# Patient Record
Sex: Female | Born: 1950 | Race: White | Hispanic: No | Marital: Married | Smoking: Never smoker
Health system: Southern US, Community
[De-identification: ages and names within clinical notes are randomized; demographics above are authoritative.]

## PROBLEM LIST (undated history)

## (undated) DIAGNOSIS — G43909 Migraine, unspecified, not intractable, without status migrainosus: Secondary | ICD-10-CM

## (undated) HISTORY — PX: TONSILLECTOMY: SUR1361

---

## 2000-04-18 ENCOUNTER — Other Ambulatory Visit: Admission: RE | Admit: 2000-04-18 | Discharge: 2000-04-18 | Payer: Self-pay | Admitting: *Deleted

## 2000-04-26 ENCOUNTER — Encounter: Payer: Self-pay | Admitting: *Deleted

## 2000-04-26 ENCOUNTER — Encounter: Admission: RE | Admit: 2000-04-26 | Discharge: 2000-04-26 | Payer: Self-pay | Admitting: *Deleted

## 2001-08-07 ENCOUNTER — Other Ambulatory Visit: Admission: RE | Admit: 2001-08-07 | Discharge: 2001-08-07 | Payer: Self-pay | Admitting: *Deleted

## 2006-07-11 ENCOUNTER — Encounter: Admission: RE | Admit: 2006-07-11 | Discharge: 2006-07-11 | Payer: Self-pay | Admitting: Obstetrics & Gynecology

## 2007-08-23 ENCOUNTER — Encounter: Admission: RE | Admit: 2007-08-23 | Discharge: 2007-08-23 | Payer: Self-pay | Admitting: Obstetrics

## 2010-03-21 ENCOUNTER — Encounter: Payer: Self-pay | Admitting: Obstetrics & Gynecology

## 2011-01-15 ENCOUNTER — Inpatient Hospital Stay (HOSPITAL_COMMUNITY)
Admission: EM | Admit: 2011-01-15 | Discharge: 2011-02-04 | DRG: 810 | Disposition: A | Discharge status: Home Health | Payer: BC Managed Care – PPO | Attending: Neurological Surgery | Admitting: Neurological Surgery

## 2011-01-15 ENCOUNTER — Other Ambulatory Visit: Payer: Self-pay

## 2011-01-15 ENCOUNTER — Inpatient Hospital Stay (HOSPITAL_COMMUNITY): Payer: BC Managed Care – PPO

## 2011-01-15 ENCOUNTER — Emergency Department (HOSPITAL_COMMUNITY): Payer: BC Managed Care – PPO

## 2011-01-15 ENCOUNTER — Encounter: Payer: Self-pay | Admitting: Emergency Medicine

## 2011-01-15 DIAGNOSIS — I609 Nontraumatic subarachnoid hemorrhage, unspecified: Principal | ICD-10-CM | POA: Diagnosis present

## 2011-01-15 DIAGNOSIS — R291 Meningismus: Secondary | ICD-10-CM | POA: Diagnosis present

## 2011-01-15 DIAGNOSIS — E119 Type 2 diabetes mellitus without complications: Secondary | ICD-10-CM | POA: Diagnosis present

## 2011-01-15 DIAGNOSIS — I67848 Other cerebrovascular vasospasm and vasoconstriction: Secondary | ICD-10-CM

## 2011-01-15 DIAGNOSIS — K5900 Constipation, unspecified: Secondary | ICD-10-CM | POA: Diagnosis not present

## 2011-01-15 DIAGNOSIS — G43909 Migraine, unspecified, not intractable, without status migrainosus: Secondary | ICD-10-CM | POA: Diagnosis present

## 2011-01-15 DIAGNOSIS — J45909 Unspecified asthma, uncomplicated: Secondary | ICD-10-CM | POA: Diagnosis present

## 2011-01-15 DIAGNOSIS — R112 Nausea with vomiting, unspecified: Secondary | ICD-10-CM | POA: Diagnosis present

## 2011-01-15 HISTORY — DX: Migraine, unspecified, not intractable, without status migrainosus: G43.909

## 2011-01-15 LAB — CBC
MCH: 33.3 pg (ref 26.0–34.0)
MCHC: 34.4 g/dL (ref 30.0–36.0)
Platelets: 284 10*3/uL (ref 150–400)
RBC: 3.96 MIL/uL (ref 3.87–5.11)

## 2011-01-15 LAB — GLUCOSE, CAPILLARY: Glucose-Capillary: 101 mg/dL — ABNORMAL HIGH (ref 70–99)

## 2011-01-15 LAB — PROTIME-INR: Prothrombin Time: 13.6 seconds (ref 11.6–15.2)

## 2011-01-15 MED ORDER — NIMODIPINE 30 MG PO CAPS
60.0000 mg | ORAL_CAPSULE | ORAL | Status: DC
  Administered 2011-01-16: 60 mg via ORAL
  Filled 2011-01-15 (×30): qty 2

## 2011-01-15 MED ORDER — SENNOSIDES-DOCUSATE SODIUM 8.6-50 MG PO TABS
1.0000 | ORAL_TABLET | Freq: Two times a day (BID) | ORAL | Status: DC
  Administered 2011-01-17 – 2011-02-04 (×33): 1 via ORAL
  Filled 2011-01-15 (×36): qty 1

## 2011-01-15 MED ORDER — MORPHINE SULFATE 2 MG/ML IJ SOLN
1.0000 mg | INTRAMUSCULAR | Status: DC | PRN
  Administered 2011-01-15 – 2011-01-20 (×28): 2 mg via INTRAVENOUS
  Administered 2011-01-21: 4 mg via INTRAVENOUS
  Administered 2011-01-21: 2 mg via INTRAVENOUS
  Administered 2011-01-21 (×2): 4 mg via INTRAVENOUS
  Administered 2011-01-21: 2 mg via INTRAVENOUS
  Administered 2011-01-21 – 2011-01-23 (×7): 4 mg via INTRAVENOUS
  Administered 2011-01-23 – 2011-01-24 (×2): 2 mg via INTRAVENOUS
  Administered 2011-01-24: 4 mg via INTRAVENOUS
  Administered 2011-01-24 – 2011-01-26 (×9): 2 mg via INTRAVENOUS
  Administered 2011-01-27: 4 mg via INTRAVENOUS
  Administered 2011-01-27 – 2011-01-28 (×4): 2 mg via INTRAVENOUS
  Filled 2011-01-15 (×3): qty 1
  Filled 2011-01-15: qty 2
  Filled 2011-01-15 (×4): qty 1
  Filled 2011-01-15: qty 2
  Filled 2011-01-15 (×14): qty 1
  Filled 2011-01-15: qty 2
  Filled 2011-01-15: qty 1
  Filled 2011-01-15: qty 2
  Filled 2011-01-15 (×3): qty 1
  Filled 2011-01-15 (×2): qty 2
  Filled 2011-01-15 (×5): qty 1
  Filled 2011-01-15: qty 2
  Filled 2011-01-15 (×7): qty 1
  Filled 2011-01-15: qty 2
  Filled 2011-01-15 (×3): qty 1
  Filled 2011-01-15: qty 2
  Filled 2011-01-15 (×2): qty 1
  Filled 2011-01-15 (×3): qty 2
  Filled 2011-01-15 (×5): qty 1

## 2011-01-15 MED ORDER — IOHEXOL 350 MG/ML SOLN
60.0000 mL | Freq: Once | INTRAVENOUS | Status: AC | PRN
  Administered 2011-01-15: 60 mL via INTRAVENOUS

## 2011-01-15 MED ORDER — MORPHINE SULFATE 4 MG/ML IJ SOLN
INTRAMUSCULAR | Status: AC
  Administered 2011-01-15: 2 mg
  Filled 2011-01-15: qty 1

## 2011-01-15 MED ORDER — ACETAMINOPHEN 650 MG RE SUPP: 650.0000 mg | RECTAL | Status: DC | PRN

## 2011-01-15 MED ORDER — PANTOPRAZOLE SODIUM 40 MG IV SOLR
40.0000 mg | Freq: Every day | INTRAVENOUS | Status: DC
  Administered 2011-01-15 – 2011-01-17 (×3): 40 mg via INTRAVENOUS
  Filled 2011-01-15 (×4): qty 40

## 2011-01-15 MED ORDER — SODIUM CHLORIDE 0.9 % IV SOLN
INTRAVENOUS | Status: DC
  Administered 2011-01-15 – 2011-01-17 (×4): via INTRAVENOUS

## 2011-01-15 MED ORDER — NIMODIPINE 30 MG/ML ORAL SOLUTION
60.0000 mg | ORAL | Status: DC
  Administered 2011-01-16 – 2011-01-19 (×17): 60 mg
  Filled 2011-01-15 (×30): qty 2

## 2011-01-15 MED ORDER — ONDANSETRON HCL 4 MG/2ML IJ SOLN
4.0000 mg | Freq: Once | INTRAMUSCULAR | Status: DC
  Filled 2011-01-15: qty 2

## 2011-01-15 MED ORDER — ACETAMINOPHEN 325 MG PO TABS: 650.0000 mg | ORAL_TABLET | ORAL | Status: DC | PRN

## 2011-01-15 MED ORDER — FENTANYL CITRATE 0.05 MG/ML IJ SOLN
50.0000 ug | Freq: Once | INTRAMUSCULAR | Status: DC
  Filled 2011-01-15: qty 2

## 2011-01-15 MED ORDER — ONDANSETRON HCL 4 MG/2ML IJ SOLN
4.0000 mg | Freq: Four times a day (QID) | INTRAMUSCULAR | Status: DC | PRN
  Administered 2011-01-15 – 2011-01-24 (×9): 4 mg via INTRAVENOUS
  Filled 2011-01-15 (×10): qty 2

## 2011-01-15 MED ORDER — LABETALOL HCL 5 MG/ML IV SOLN
10.0000 mg | INTRAVENOUS | Status: DC | PRN
Start: 2011-01-15 — End: 2011-02-04
  Filled 2011-01-15: qty 8

## 2011-01-15 NOTE — ED Notes (Signed)
PT. ARRIVED WITH EMS FORM [facility] ER- REPORTS SEVERE HEADACHE ONSET LAST NIGHT CT SCAN RESULT SHOWS ACUTE SUBARACHNOID HEMORRHAGE .  RECEIVED MORPHINE 8 MG IV AND ZOFRAN 4 MG IV PRIOR TO ARRIVAL .

## 2011-01-15 NOTE — ED Notes (Signed)
ASSUMED CARE ON PT. INTRODUCED SELF , CALL LIGHT WITHIN REACH , PLACED ON MONITOR ,  BEDRAILS UP , REPOSITIONED FOR COMFORT. RESPIRATIONS UNLABORED , IV BY EMS INTACT . PT. WAITING FOR EDP EVALUATION . DR, Danielle Dess NOTIFIED ON PT.S' ARRIVAL.

## 2011-01-15 NOTE — Progress Notes (Signed)
  Patient has undergone CT angiography. The study demonstrates no evidence of an aneurysm. When combined with the evidence from the CT of blood in the ambient cistern and the fourth ventricle the patient's good neurologic status it appears the patient has sustained a perimesencephalic venous hemorrhage. The situation was discussed with the patient and her family. Will be maintained in the intensive care unit for observation calcium channel blocking agents have been started. He will determine if formal catheter angiography is necessary.

## 2011-01-15 NOTE — ED Notes (Signed)
MD at bedside. Dr. Caralee Ates at bedside.

## 2011-01-15 NOTE — Progress Notes (Signed)
Speech Language/Pathology Orders received for Speech Language Evaluation and Treatment. Evaluation not completed due to patient presenting with nausea and unable to participate. ST to follow on 01-16-11 Moreen Fowler, M.S., CCC-SLP [phone]

## 2011-01-15 NOTE — H&P (Addendum)
Kaitlin Banks is an 60 y.o. female.   Chief Complaint: Acute onset headache HPI: Patient is a 60 year old right-handed white female who developed headache approximately 10 PM last night the headache was acute in onset cause her to become nauseous and vomit. It seemed to worsen sometime in the middle of the night at approximately 3:15 this morning she was brought to the emergency department at [facility]. A CT scan of the head was performed demonstrated a blood in the basilar cisterns and blood in the fourth ventricle. Is transferred to [facility] emergency room for further evaluation and treatment of this process. The patient gives no history of previous headache and in general her health has been very good.S he relates no significant surgery other than tonsillectomy as a young person. She does not see a physician regularly but when she does it is that [facility].  Past Medical History  Diagnosis Date  . Migraine headache   . Asthma   . Diabetes mellitus     Past Surgical History  Procedure Date  . Tonsillectomy     History reviewed. No pertinent family history. Social History:  reports that she has never smoked. She does not have any smokeless tobacco history on file. She reports that she does not drink alcohol or use illicit drugs.  Allergies: No Known Allergies  Medications Prior to Admission  Medication Dose Route Frequency Provider Last Rate Last Dose  . 0.9 %  sodium chloride infusion   Intravenous Continuous Shary Key Theadore Blunck 100 mL/hr at 01/17/11 2014    . acetaminophen (TYLENOL) tablet 650 mg  650 mg Oral Q4H PRN Shary Key Wilhelmena Zea       Or  . acetaminophen (TYLENOL) suppository 650 mg  650 mg Rectal Q4H PRN Stefani Dama      . iohexol (OMNIPAQUE) 350 MG/ML injection 60 mL  60 mL Intravenous Once PRN Medication Radiologist   60 mL at 01/15/11 0821  . labetalol (NORMODYNE,TRANDATE) injection 10-40 mg  10-40 mg Intravenous Q10 min PRN Stefani Dama      .  morphine 2 MG/ML injection 1-4 mg  1-4 mg Intravenous Q1H PRN Shary Key Braelee Herrle   2 mg at 01/19/11 0316  . morphine 4 MG/ML injection        2 mg at 01/15/11 1331  . niMODipine (NIMOTOP) capsule 60 mg  60 mg Oral Q4H Tydarius Yawn J Fabiana Dromgoole   60 mg at 01/16/11 2215   Or  . niMODipine (NIMOTOP) 30 mg/mL oral solution 60 mg  60 mg Per Tube Q4H Beryl Balz J Briceyda Abdullah   60 mg at 01/19/11 0653  . ondansetron (ZOFRAN) injection 4 mg  4 mg Intravenous Q6H PRN Shary Key Litzi Binning   4 mg at 01/18/11 2101  . pantoprazole sodium (PROTONIX) 40 mg/20 mL oral suspension 40 mg  40 mg Per Tube Q1200 Kendra P Hiatt, PHARMD      . senna-docusate (Senokot-S) tablet 1 tablet  1 tablet Oral BID Shary Key Elfa Wooton   1 tablet at 01/18/11 2201  . DISCONTD: fentaNYL (SUBLIMAZE) injection 50 mcg  50 mcg Intravenous Once Hurman Horn, MD      . DISCONTD: ondansetron [facility]) injection 4 mg  4 mg Intravenous Once Hurman Horn, MD      . DISCONTD: pantoprazole (PROTONIX) injection 40 mg  40 mg Intravenous QHS Shary Key Naziya Hegwood   40 mg at 01/17/11 2203   No current outpatient prescriptions on file as of 01/19/2011.  No results found for this or any previous visit (from the past 48 hour(s)). Ct Head Wo Contrast  01/18/2011  *RADIOLOGY REPORT*  Clinical Data: Followup subarachnoid hemorrhage.  Rule out hydrocephalus.  CT HEAD WITHOUT CONTRAST  Technique:  Contiguous axial images were obtained from the base of the skull through the vertex without contrast.  Comparison: 01/15/2011.  Findings: There is been decrease in the amount of subarachnoid blood which has shifted position slightly.  Subarachnoid blood remains.  No interval development of hydrocephalus or CT evidence of large acute infarct.  No intracranial mass lesion detected on this unenhanced exam.  Polypoid opacification left maxillary sinus.  Minimal mucosal thickening right maxillary sinus and mild mucosal thickening ethmoid sinus air cells.  IMPRESSION: Decrease in amount of subarachnoid blood  which has shifted position slightly (more notable in the left parietal - occipital lobe region).  No evidence of hydrocephalus or CT findings of large acute infarct.  Original Report Authenticated By: Fuller Canada, M.D.    Review of Systems  Constitutional: Negative.   Eyes: Negative.   Respiratory: Negative.   Cardiovascular: Negative.   Gastrointestinal: Positive for nausea and vomiting.  Musculoskeletal: Negative.   Skin: Negative.   Neurological: Positive for headaches.  Endo/Heme/Allergies:       Borderline diabetes    Blood pressure 120/56, pulse 59, temperature 98.7 F (37.1 C), temperature source Oral, resp. rate 11, height 5\' 4"  (1.626 m), weight 67.4 kg (148 lb 9.4 oz), SpO2 94.00%. Physical Exam : She is arousable to voice. She follows commands moving all 4 extremities answers questions appropriately but is lethargic. When not actively engaged in conversation she lays with her eyes closed. She admits to headache and significant neck stiffness lifts her head off the low with great difficulty and planing of pain in the back of the neck. Her pupils are 3 mm briskly reactive to light and accommodation the extraocular movements are full the face is symmetric to grimace tongue and uvula are in the midline facial sensation appears symmetric and all 3 distributions. 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  Extremities reveal no evidence of a cortical drift tone is normal and deep tendon reflexes are normal in the biceps triceps patellae and Achilles Babinski reflexes are equivocal bilaterally. Gen. physical exam reveals that the neck shows no masses no bruits are heard heart has a regular rate and rhythm no murmurs noted. Lungs are clear to auscultation. Abdomen is soft bowel sounds are positive no masses are noted. Extremities reveal no cyanosis clubbing or edema.  Assessment/Plan The patient has a subarachnoid hemorrhage in the basilar cisterns of the brain there is blood in the fourth ventricle.  Hunt and Hess grade 2.  Patient will undergo a CT angiogram for evaluation of an aneurysm. If present decision will be made whether to coil or to clip this aneurysm surgically. Interventional radiology has been notified. The patient will be admitted to the intensive care unit for monitoring after the angiogram.  Elizette Shek J 01/19/2011, 9:02 AM

## 2011-01-15 NOTE — ED Provider Notes (Signed)
Medical Screening Exam initiated and feel Pt stable for completion of MSE by private attending.  This 60 year old female with history of migraine headaches had a sudden onset severe headache last night different than her prior migraines without associated neurologic deficits and was diagnosed by CT scan at an outside facility of having a subarachnoid hemorrhage. She was transferred here to [facility] to have an evaluation by neurosurgery. The patient has no change in speech vision swallowing or understanding and no localized weakness numbness or incoordination. Her airway is patent and maintained with normal speech, her vision she states is normal for her with extraocular movements intact, her lungs are clear to auscultation with breathing unlabored, her Glasgow Coma Scale is 15. She appears stable for completion of the medical screening examination by neurosurgery. Neurosurgery has already ordered a cerebral angiogram.  The patient still has ongoing headache and nausea with medications ordered upon my initial examination in the ED.7:19 AM  Hurman Horn, MD 01/15/11 [phone]

## 2011-01-16 ENCOUNTER — Other Ambulatory Visit: Payer: Self-pay

## 2011-01-16 NOTE — Progress Notes (Signed)
PT Cancellation Note  PT evaluation cancelled today due to medical issues with patient which prohibited therapy - patient complained of severe headache. Patient refused to participate in am and pm. Will return tomorrow to attempt PT eval.  Durenda Hurt. Renaldo Fiddler, [facility] Services Pager [phone]

## 2011-01-16 NOTE — Progress Notes (Signed)
Subjective: Patient reports That she has moderately severe headache. She has some sensitivity to light. She's also complaining of some nausea. She denies any numbness tingling or weakness. She denies any diplopia.  Objective: Vital signs in last 24 hours: Temp:  [98.6 F (37 C)-99.6 F (37.6 C)] 98.8 F (37.1 C) (11/18 0400) Pulse Rate:  [56-81] 77  (11/18 0900) Resp:  [10-19] 13  (11/18 0900) BP: (89-150)/(41-74) 134/70 mmHg (11/18 0900) SpO2:  [91 %-97 %] 95 % (11/18 0900)  Intake/Output from previous day: 11/17 0701 - 11/18 0700 In: 2192 [I.V.:2180; IV Piggyback:12] Out: 500 [Urine:500] Intake/Output this shift: Total I/O In: 100 [I.V.:100] Out: -   On examination: She is awake and alert. She is oriented and appropriate. Cranial nerve function is intact. Motor exam is 5 over 5 bilaterally. No evidence of pronator drift. Chest and abdomen benign.  Lab Results:  Basename 01/15/11 1050  WBC 11.8*  HGB 13.2  HCT 38.4  PLT 284   BMET No results found for this basename: NA:2,K:2,CL:2,CO2:2,GLUCOSE:2,BUN:2,CREATININE:2,CALCIUM:2 in the last 72 hours  Studies/Results: Ct Angio Head W/cm &/or Wo Cm  01/15/2011  *RADIOLOGY REPORT*  Clinical Data:  Headache.  Subarachnoid hemorrhage  CT ANGIOGRAPHY HEAD AND NECK  Technique:  Multidetector CT imaging of the head and neck was performed using the standard protocol during bolus administration of intravenous contrast.  Multiplanar CT image reconstructions including MIPs were obtained to evaluate the vascular anatomy. Carotid stenosis measurements (when applicable) are obtained utilizing NASCET criteria, using the distal internal carotid diameter as the denominator.  Contrast: 60mL OMNIPAQUE IOHEXOL 350 MG/ML IV SOLN  Comparison:  None  CTA NECK  Findings:  Standard branching aortic arch.  No significant atherosclerotic disease of the aortic arch.  Right carotid:  Common carotid artery is widely patent.  Carotid bifurcation is widely patent  without stenosis.  No evidence of atherosclerotic disease.  No dissection or aneurysm.  Left carotid:  Left common carotid artery is widely patent. Carotid bifurcation and internal carotid artery are  widely patent. No evidence of atherosclerotic disease or dissection.  Vertebral arteries:  Both vertebral arteries are equal in size and patent to the basilar without stenosis or dissection.   Review of the MIP images confirms the above findings.  IMPRESSION: Negative  CTA HEAD  Findings:  Preliminary unenhanced images of the brain reveal subarachnoid hemorrhage in the basilar cisterns.  This is relatively symmetric surrounding the mid brain.  There is a small amount of blood at the  base of the brain.  No intraventricular hemorrhage is present.  There is no hydrocephalus.  No acute infarct or mass.  Both vertebral arteries are patent to the basilar without stenosis. PICA, AICA, superior cerebellar, and posterior cerebral arteries are widely patent without stenosis.  The cavernous carotid is widely patent without stenosis.  Anterior and middle cerebral arteries are patent bilaterally without stenosis.  Negative for aneurysm or vascular malformation.  No cause for subarachnoid hemorrhage is identified.   Review of the MIP images confirms the above findings.  IMPRESSION: .  Perimesencephalic subarachnoid hemorrhage.  No vascular malformation is identified.  This type of  hemorrhage can be seen with nonaneurysmal subarachnoid hemorrhage.  Original Report Authenticated By: Camelia Phenes, M.D.   Ct Angio Neck W/cm &/or Wo/cm  01/15/2011  *RADIOLOGY REPORT*  Clinical Data:  Headache.  Subarachnoid hemorrhage  CT ANGIOGRAPHY HEAD AND NECK  Technique:  Multidetector CT imaging of the head and neck was performed using the standard protocol during  bolus administration of intravenous contrast.  Multiplanar CT image reconstructions including MIPs were obtained to evaluate the vascular anatomy. Carotid stenosis measurements  (when applicable) are obtained utilizing NASCET criteria, using the distal internal carotid diameter as the denominator.  Contrast: 60mL OMNIPAQUE IOHEXOL 350 MG/ML IV SOLN  Comparison:  None  CTA NECK  Findings:  Standard branching aortic arch.  No significant atherosclerotic disease of the aortic arch.  Right carotid:  Common carotid artery is widely patent.  Carotid bifurcation is widely patent without stenosis.  No evidence of atherosclerotic disease.  No dissection or aneurysm.  Left carotid:  Left common carotid artery is widely patent. Carotid bifurcation and internal carotid artery are  widely patent. No evidence of atherosclerotic disease or dissection.  Vertebral arteries:  Both vertebral arteries are equal in size and patent to the basilar without stenosis or dissection.   Review of the MIP images confirms the above findings.  IMPRESSION: Negative  CTA HEAD  Findings:  Preliminary unenhanced images of the brain reveal subarachnoid hemorrhage in the basilar cisterns.  This is relatively symmetric surrounding the mid brain.  There is a small amount of blood at the  base of the brain.  No intraventricular hemorrhage is present.  There is no hydrocephalus.  No acute infarct or mass.  Both vertebral arteries are patent to the basilar without stenosis. PICA, AICA, superior cerebellar, and posterior cerebral arteries are widely patent without stenosis.  The cavernous carotid is widely patent without stenosis.  Anterior and middle cerebral arteries are patent bilaterally without stenosis.  Negative for aneurysm or vascular malformation.  No cause for subarachnoid hemorrhage is identified.   Review of the MIP images confirms the above findings.  IMPRESSION: .  Perimesencephalic subarachnoid hemorrhage.  No vascular malformation is identified.  This type of  hemorrhage can be seen with nonaneurysmal subarachnoid hemorrhage.  Original Report Authenticated By: Camelia Phenes, M.D.    Assessment/Plan: Status post  non-aneurysmal subarachnoid hemorrhage. Plan continue IV hydration and ICU observation. Will slowly mobilized.  LOS: 1 day     Andris Brothers A 01/16/2011, 10:11 AM

## 2011-01-17 LAB — BASIC METABOLIC PANEL
BUN: 12 mg/dL (ref 6–23)
Calcium: 8.4 mg/dL (ref 8.4–10.5)
Creatinine, Ser: 0.65 mg/dL (ref 0.50–1.10)
GFR calc Af Amer: >90 mL/min (ref >90)
GFR calc non Af Amer: >90 mL/min (ref >90)
Glucose, Bld: 82 mg/dL (ref 70–99)

## 2011-01-17 LAB — CBC
HCT: 37.2 % (ref 36.0–46.0)
Hemoglobin: 12.8 g/dL (ref 12.0–15.0)
MCH: 33.8 pg (ref 26.0–34.0)
MCHC: 34.4 g/dL (ref 30.0–36.0)
RDW: 12.4 % (ref 11.5–15.5)

## 2011-01-17 NOTE — Progress Notes (Signed)
Occupational Therapy Evaluation Patient Details Name: Kaitlin Banks MRN: [id number] DOB: 10/05/1950 Today's Date: 01/17/2011  Problem List:  Patient Active Problem List  Diagnoses  . Subarachnoid hemorrhage    Past Medical History:  Past Medical History  Diagnosis Date  . Migraine headache   . Asthma   . Diabetes mellitus    Past Surgical History:  Past Surgical History  Procedure Date  . Tonsillectomy     OT Assessment/Plan/Recommendation OT Assessment Clinical Impression Statement: Patient s/p SAH with greatest limiting factors being pain and dizziness related to headache. Will benefit from skilled OT in the acute setting to maximize independence with ADL and ADL mobility prior to d/c home. OT Recommendation/Assessment: Patient will need skilled OT in the acute care venue OT Problem List: Decreased activity tolerance;Impaired balance (sitting and/or standing);Decreased knowledge of use of DME or AE;Decreased knowledge of precautions;Pain OT Plan OT Frequency: Min 2X/week OT Treatment/Interventions: Self-care/ADL training;DME and/or AE instruction;Therapeutic activities;Patient/family education;Balance training OT Recommendation Follow Up Recommendations: Home health OT (pending patient progress) Equipment Recommended:  (TBA) Individuals Consulted Consulted and Agree with Results and Recommendations: Family member/caregiver;Patient Family Member Consulted: husband OT Goals Acute Rehab OT Goals OT Goal Formulation: With patient/family Time For Goal Achievement: 7 days ADL Goals Pt Will Perform Grooming: Independently;Standing at sink ADL Goal: Grooming - Progress: Other (comment) Pt Will Perform Lower Body Bathing: with modified independence;Sit to stand in shower ADL Goal: Lower Body Bathing - Progress: Other (comment) Pt Will Perform Lower Body Dressing: Independently;Sit to stand from bed ADL Goal: Lower Body Dressing - Progress: Other (comment) Pt Will Transfer  to Toilet: Independently;Ambulation;Regular height toilet ADL Goal: Toilet Transfer - Progress: Other (comment) Pt Will Perform Toileting - Clothing Manipulation: with modified independence ADL Goal: Toileting - Clothing Manipulation - Progress: Other (comment) Pt Will Perform Toileting - Hygiene: Independently;Sitting on 3-in-1 or toilet ADL Goal: Toileting - Hygiene - Progress: Other (comment) Pt Will Perform Tub/Shower Transfer: Shower transfer;Independently;Ambulation (built in shower seat and hand held shower head) ADL Goal: Tub/Shower Transfer - Progress: Other (comment)  OT Evaluation Precautions/Restrictions  Precautions Precautions: Fall Required Braces or Orthoses: No Restrictions Weight Bearing Restrictions: No Prior Functioning Home Living Lives With: Spouse Type of Home: House Home Layout: One level Home Access: Stairs to enter [employer] of Steps: 1 Bathroom Shower/Tub: Tub/shower unit;Walk-in shower;Door [employer] Toilet: Standard Bathroom Accessibility: No Hotel manager with walk-in shower not accessible with RW.) Home Adaptive Equipment: None Prior Function Level of Independence: Independent with basic ADLs;Independent with homemaking with ambulation Able to Take Stairs?: Reciprically Driving: Yes Vocation: Full time employment Leisure: Hobbies-yes (Comment) (designing) ADL ADL Toilet Transfer: Performed;Minimal assistance Toilet Transfer Details (indicate cue type and reason): pt with difficulty keeping eyes open secondary to HA pain Toilet Transfer Method: Stand pivot Toilet Transfer Equipment: Bedside commode Toileting - Clothing Manipulation: Performed;Minimal assistance Where Assessed - Toileting Clothing Manipulation: Standing Toileting - Hygiene: Performed;Set up;Minimal assistance (assist required to maintain standing balance) Where Assessed - Toileting Hygiene: Standing ADL Comments: limited eval secondary to patient HA  pain Vision/Perception  Vision - Assessment Vision Assessment: Vision not tested Additional Comments: Will test next session Cognition Cognition Orientation Level: Oriented X4 Sensation/Coordination Sensation Light Touch: Appears Intact Coordination Gross Motor Movements are Fluid and Coordinated: Yes Fine Motor Movements are Fluid and Coordinated: Yes Extremity Assessment RUE Assessment RUE Assessment: Within Functional Limits LUE Assessment LUE Assessment: Within Functional Limits Mobility  Bed Mobility Bed Mobility: Yes Rolling Left: 4: Min assist Left Sidelying to Sit: 4:  Min assist;HOB elevated (comment degrees);With rails (~20) Sit to Supine - Left: 4: Min assist;With rail;HOB flat Transfers Transfers: Yes Sit to Stand: 4: Min assist;From bed;With upper extremity assist Sit to Stand Details (indicate cue type and reason): dizziness upon standing. BP stable sitting and standing. Dropped as patient returning to supine after toileting. MD in room and made aware. Stand to Sit: 4: Min assist;With upper extremity assist;To bed;To chair/3-in-1 (VC for hand placement) End of Session OT - End of Session Equipment Utilized During Treatment: Gait belt Activity Tolerance: Patient limited by pain (and dizziness) Patient left: in bed;with call bell in reach;with family/visitor present Nurse Communication: Mobility status for transfers;Mobility status for ambulation General Behavior During Session: Lethargic Cognition: WFL for tasks performed   Bellamarie Pflug 01/17/2011, 5:05 PM

## 2011-01-17 NOTE — Progress Notes (Signed)
Speech Language Pathology SLP Cancellation Note Treatment cancelled today due to patient's refusal to participate due to fatigue.  Will attempt to complete cognitive-linguistic assessment on 01/18/11.  Myra Rude, M.S.,CCC-SLP Pager [phone]

## 2011-01-17 NOTE — Progress Notes (Signed)
PT Cancellation Note Evaluation cancelled today due to patient's refusal to participate secondary to feeling unwell. She requests that we return tomorrow. 01/17/2011 Edwyna Perfect, PT  Pager [phone]

## 2011-01-17 NOTE — Progress Notes (Signed)
Subjective: Patient reports Headache  Objective: Vital signs in last 24 hours: Temp:  [98.9 F (37.2 C)-99.5 F (37.5 C)] 99.5 F (37.5 C) (11/19 1925) Pulse Rate:  [54-95] 56  (11/19 2200) Resp:  [12-23] 21  (11/19 2200) BP: (105-129)/(49-59) 114/55 mmHg (11/19 2200) SpO2:  [88 %-100 %] 97 % (11/19 2200)  Intake/Output from previous day: 11/18 0701 - 11/19 0700 In: 2440 [P.O.:30; I.V.:2400; IV Piggyback:10] Out: 1150 [Urine:1150] Intake/Output this shift: Total I/O In: 420 [P.O.:120; I.V.:300] Out: 750 [Urine:750]  arouses easily to voice,moces all extremities without evidence of drift  Lab Results:  [facility] 01/17/11 0603 01/15/11 1050  WBC 11.4* 11.8*  HGB 12.8 13.2  HCT 37.2 38.4  PLT 251 284   BMET  Basename 01/17/11 0603  NA 140  K 3.3*  CL 103  CO2 26  GLUCOSE 82  BUN 12  CREATININE 0.65  CALCIUM 8.4    Studies/Results: No results found.  Assessment/Plan: Subarachnoid Grade2  LOS: 2 days  start to mobilize   Shantia Sanford J 01/17/2011, 11:53 PM

## 2011-01-18 ENCOUNTER — Inpatient Hospital Stay (HOSPITAL_COMMUNITY): Payer: BC Managed Care – PPO

## 2011-01-18 MED ORDER — PANTOPRAZOLE SODIUM 40 MG PO PACK
40.0000 mg | PACK | Freq: Every day | ORAL | Status: DC
  Administered 2011-01-20 – 2011-02-03 (×13): 40 mg
  Filled 2011-01-18 (×18): qty 20

## 2011-01-18 NOTE — Progress Notes (Signed)
OT Cancellation Note  Treatment cancelled today due to patient's refusal to participate: patient states nauseated and has extreme headache pain. RN is aware. Will check back as schedule allows.  Glendale Chard    OTR/L Pager: [phone] 01/18/2011

## 2011-01-18 NOTE — Progress Notes (Signed)
Physical Therapy Evaluation Patient Details Name: Kaitlin Banks MRN: [id number] DOB: [birth year] Today's Date: 01/18/2011  Problem List:  Patient Active Problem List  Diagnoses  . Subarachnoid hemorrhage    Past Medical History:  Past Medical History  Diagnosis Date  . Migraine headache   . Asthma   . Diabetes mellitus    Past Surgical History:  Past Surgical History  Procedure Date  . Tonsillectomy     PT Assessment/Plan/Recommendation PT Assessment Clinical Impression Statement: pt s/p Lt parieto-occipital SAH with resulting acute pain limiting mobility.  Pt instructed to ambulate 2-3 times per day with therapy and nursing to prepare herself for d/c home with family.  PT Recommendation/Assessment: Patient will need skilled PT in the acute care venue PT Problem List: Decreased activity tolerance;Decreased balance;Decreased mobility;Pain Barriers to Discharge: None PT Therapy Diagnosis : Difficulty walking;Acute pain PT Plan PT Frequency: Min 4X/week PT Treatment/Interventions: DME instruction;Gait training;Functional mobility training;Balance training;Patient/family education PT Recommendation Follow Up Recommendations: Home health PT Equipment Recommended: None recommended by PT PT Goals  Acute Rehab PT Goals PT Goal Formulation: With patient Time For Goal Achievement: 7 days Pt will go Supine/Side to Sit: with modified independence;with HOB 0 degrees PT Goal: Supine/Side to Sit - Progress: Other (comment) Pt will go Sit to Supine/Side: with modified independence;with HOB 0 degrees PT Goal: Sit to Supine/Side - Progress: Other (comment) Pt will Transfer Sit to Stand/Stand to Sit: with supervision;with upper extremity assist PT Transfer Goal: Sit to Stand/Stand to Sit - Progress: Other (comment) Pt will Ambulate: 51 - 150 feet;with supervision;with least restrictive assistive device PT Goal: Ambulate - Progress: Other (comment) Additional Goals Additional Goal #1:  Pt will score >19 on DGI balance assessment to indicate low fall risk PT Goal: Additional Goal #1 - Progress: Other (comment)  PT Evaluation Precautions/Restrictions  Precautions Precautions: Fall Required Braces or Orthoses: No Restrictions Weight Bearing Restrictions: No Prior Functioning  Home Living Receives Help From: Family (spouse is retired) Prior Function Comments: sets up [employer] Cognition Arousal/Alertness: Awake/alert Overall Cognitive Status: Appears within functional limits for tasks assessed Orientation Level: Oriented X4 Cognition - Other Comments: requires max encouragement; self-limiting behaviors Sensation/Coordination Sensation Light Touch: Appears Intact Extremity Assessment RLE Assessment RLE Assessment: Within Functional Limits LLE Assessment LLE Assessment: Within Functional Limits Mobility (including Balance) Bed Mobility Bed Mobility: Yes Rolling Right: 5: Supervision;With rail Rolling Right Details (indicate cue type and reason): Supervision for lines (in ICU) Right Sidelying to Sit: 5: Supervision;HOB elevated (comment degrees);With rails Right Sidelying to Sit Details (indicate cue type and reason): HOB 20; supervision due to lines Sitting - Scoot to Edge of Bed: 5: Supervision Sitting - Scoot to Edge of Bed Details (indicate cue type and reason): for safety Transfers Sit to Stand: 4: Min assist;With upper extremity assist;With armrests;From bed;From chair/3-in-1 Sit to Stand Details (indicate cue type and reason): transfer x 3 (bed, 3n1, chair); minguard A due to pt fearful of becoming dizzy Stand to Sit: 4: Min assist;With upper extremity assist;With armrests;To bed;To chair/3-in-1 Stand to Sit Details: minguard assist Ambulation/Gait Ambulation/Gait: Yes Ambulation/Gait Assistance: 4: Min assist Ambulation/Gait Assistance Details (indicate cue type and reason): second person for IV and lines Ambulation Distance (Feet): 60  Feet Assistive device: 1 person hand held assist;Other (Comment) (at times holding IV pole with other hand) Gait Pattern: Decreased step length - right;Decreased step length - left Gait velocity: very limited Stairs: No Wheelchair Mobility Wheelchair Mobility: No  Posture/Postural Control Posture/Postural Control: No significant limitations Balance Balance  Assessed: No (pt too nauseous and feeling weak/hot) Exercise    End of Session PT - End of Session Equipment Utilized During Treatment: Gait belt Activity Tolerance: Patient limited by pain;Other (comment) (self-limiting due to "weak" and "dizzy") Patient left: in chair;with call bell in reach;with family/visitor present Nurse Communication: Mobility status for ambulation General Behavior During Session: Flat affect (kept eyes closed majority of session due to lights hurting) Cognition: [facility] for tasks performed  Kaitlin Banks 01/18/2011, 3:01 PM Pager [phone]

## 2011-01-18 NOTE — Progress Notes (Signed)
Speech Language/Pathology Speech Language Pathology Evaluation Patient Details Name: Kaitlin Banks MRN: [id number] DOB: 12/02/1950 Today's Date: 01/18/2011  Problem List:  Patient Active Problem List  Diagnoses  . Subarachnoid hemorrhage    Past Medical History:  Past Medical History  Diagnosis Date  . Migraine headache   . Asthma   . Diabetes mellitus    Past Surgical History:  Past Surgical History  Procedure Date  . Tonsillectomy     SLP Assessment/Plan/Recommendation Assessment Clinical Impression Statement: Pt appears WFL with brief cognitive linguistic eval.  Higher level verbal/functional task not attempted due to pts discomfort.  Discussed signs of impairment with pt.  No SLP f/u needed at this time.  If concerns arise, please reorder.  SLP Recommendation/Assessment: Patent does not need any further Speech Lanaguage Pathology Services No Skilled Speech Therapy: All education completed;Patient at baseline level of functioning;Patient is independent with all cognitive/linguistic skills Recommendation Follow up Recommendations: None Equipment Recommended:  (TBA) Individuals Consulted Consulted and Agree with Results and Recommendations: Patient  Harlon Ditty, MA CCC-SLP [phone]  Claudine Mouton 01/18/2011, 9:26 AM

## 2011-01-18 NOTE — Progress Notes (Signed)
Subjective: Patient reports headache  Objective: Vital signs in last 24 hours: Temp:  [98.4 F (36.9 C)-99.5 F (37.5 C)] 98.4 F (36.9 C) (11/20 0900) Pulse Rate:  [54-72] 61  (11/20 1300) Resp:  [10-21] 16  (11/20 1300) BP: (102-135)/(46-68) 135/68 mmHg (11/20 1300) SpO2:  [91 %-100 %] 98 % (11/20 1300)  Intake/Output from previous day: 11/19 0701 - 11/20 0700 In: 2600 [P.O.:300; I.V.:2300] Out: 2200 [Urine:2200] Intake/Output this shift: Total I/O In: 400 [I.V.:400] Out: -   cranial nerves normal, meningismus 2+, no drift motor intact.  Lab Results:  [facility] 01/17/11 0603  WBC 11.4*  HGB 12.8  HCT 37.2  PLT 251   BMET  Basename 01/17/11 0603  NA 140  K 3.3*  CL 103  CO2 26  GLUCOSE 82  BUN 12  CREATININE 0.65  CALCIUM 8.4    Studies/Results: Ct Head Wo Contrast  01/18/2011  *RADIOLOGY REPORT*  Clinical Data: Followup subarachnoid hemorrhage.  Rule out hydrocephalus.  CT HEAD WITHOUT CONTRAST  Technique:  Contiguous axial images were obtained from the base of the skull through the vertex without contrast.  Comparison: 01/15/2011.  Findings: There is been decrease in the amount of subarachnoid blood which has shifted position slightly.  Subarachnoid blood remains.  No interval development of hydrocephalus or CT evidence of large acute infarct.  No intracranial mass lesion detected on this unenhanced exam.  Polypoid opacification left maxillary sinus.  Minimal mucosal thickening right maxillary sinus and mild mucosal thickening ethmoid sinus air cells.  IMPRESSION: Decrease in amount of subarachnoid blood which has shifted position slightly (more notable in the left parietal - occipital lobe region).  No evidence of hydrocephalus or CT findings of large acute infarct.  Original Report Authenticated By: Fuller Canada, M.D.    Assessment/Plan: Ct stable day 3 post bleed. Sah resolving, no hydrocephalus. Slow clinical progress.  LOS: 3 days  Moblize more  aggresssively. Rehab med consult   Stefani Dama 01/18/2011, 1:38 PM

## 2011-01-19 DIAGNOSIS — I609 Nontraumatic subarachnoid hemorrhage, unspecified: Secondary | ICD-10-CM

## 2011-01-19 LAB — CBC
MCH: 33.6 pg (ref 26.0–34.0)
MCHC: 34.9 g/dL (ref 30.0–36.0)
Platelets: 285 10*3/uL (ref 150–400)
RDW: 11.9 % (ref 11.5–15.5)

## 2011-01-19 LAB — BASIC METABOLIC PANEL
Calcium: 8.6 mg/dL (ref 8.4–10.5)
GFR calc Af Amer: >90 mL/min (ref >90)
GFR calc non Af Amer: >90 mL/min (ref >90)
Glucose, Bld: 113 mg/dL — ABNORMAL HIGH (ref 70–99)
Potassium: 3.1 mEq/L — ABNORMAL LOW (ref 3.5–5.1)
Sodium: 137 mEq/L (ref 135–145)

## 2011-01-19 MED ORDER — POTASSIUM CHLORIDE 20 MEQ PO PACK
20.0000 meq | PACK | Freq: Two times a day (BID) | ORAL | Status: DC
Start: 2011-01-19 — End: 2011-01-19
  Filled 2011-01-19: qty 1

## 2011-01-19 MED ORDER — POTASSIUM CHLORIDE 20 MEQ PO PACK
20.0000 meq | PACK | Freq: Two times a day (BID) | ORAL | Status: DC
  Filled 2011-01-19: qty 1

## 2011-01-19 MED ORDER — POTASSIUM CHLORIDE CRYS ER 20 MEQ PO TBCR
20.0000 meq | EXTENDED_RELEASE_TABLET | Freq: Two times a day (BID) | ORAL | Status: DC
  Filled 2011-01-19: qty 1

## 2011-01-19 MED ORDER — SODIUM CHLORIDE 0.9 % IV SOLN
INTRAVENOUS | Status: DC
  Administered 2011-01-19: 15:00:00 via INTRAVENOUS

## 2011-01-19 MED ORDER — POTASSIUM CHLORIDE 20 MEQ/15ML (10%) PO LIQD
20.0000 meq | Freq: Two times a day (BID) | ORAL | Status: DC
  Administered 2011-01-20 – 2011-02-04 (×29): 20 meq via ORAL
  Filled 2011-01-19 (×33): qty 15

## 2011-01-19 MED ORDER — NIMODIPINE 30 MG/ML ORAL SOLUTION
30.0000 mg | ORAL | Status: DC
  Administered 2011-01-20 – 2011-02-01 (×67): 30 mg via ORAL
  Filled 2011-01-19 (×83): qty 1

## 2011-01-19 MED ORDER — POTASSIUM CHLORIDE IN NACL 20-0.9 MEQ/L-% IV SOLN
INTRAVENOUS | Status: DC
  Administered 2011-01-19 – 2011-01-31 (×12): via INTRAVENOUS
  Filled 2011-01-19 (×22): qty 1000

## 2011-01-19 MED ORDER — NIMODIPINE 30 MG PO CAPS
30.0000 mg | ORAL_CAPSULE | ORAL | Status: DC
  Administered 2011-01-19 (×2): 30 mg via ORAL
  Filled 2011-01-19 (×5): qty 1

## 2011-01-19 NOTE — Progress Notes (Signed)
Occupational Therapy Treatment Patient Details Name: Kaitlin Banks MRN: [id number] DOB: 1950-10-04 Today's Date: 01/19/2011  OT Assessment/Plan OT Assessment/Plan OT Plan: Discharge plan remains appropriate Follow Up Recommendations: Home health OT- pending progress OT Goals ADL Goals ADL Goal: Toilet Transfer - Progress: Progressing toward goals ADL Goal: Toileting - Clothing Manipulation - Progress: Progressing toward goals ADL Goal: Toileting - Hygiene - Progress: Progressing toward goals  OT Treatment Precautions/Restrictions  Precautions Precautions: Fall Restrictions Weight Bearing Restrictions: No   ADL ADL Toilet Transfer: Performed;Minimal Dentist Details (indicate cue type and reason): Hand held assist and cues to open eyes Toilet Transfer Method: Proofreader: Bedside commode Toileting - Clothing Manipulation: Minimal assistance;Performed Where Assessed - Glass blower/designer Manipulation: Standing Toileting - Hygiene: Performed;Set up;Minimal assistance Where Assessed - Toileting Hygiene: Standing Mobility  Bed Mobility Sit to Supine - Left: HOB flat (Min guard) Transfers Sit to Stand: 4: Min assist;Without upper extremity assist;From chair/3-in-1 Stand to Sit: 4: Min assist;Without upper extremity assist;To bed (cues for hand placement) End of Session OT - End of Session Equipment Utilized During Treatment: Gait belt Activity Tolerance:  (Patient limited by nausea and HA pain) Patient left: in bed;with call bell in reach;with family/visitor present Nurse Communication: Mobility status for transfers;Mobility status for ambulation General Behavior During Session:  (unable to keep eyes open secondary to HA pain and nausea. ) Cognition: WFL for tasks performed  Ambermarie Honeyman  01/19/2011, 11:55 AM

## 2011-01-19 NOTE — Progress Notes (Signed)
Utilization review completed. Lleyton Byers, RN, BSN. 01/19/11 

## 2011-01-19 NOTE — Progress Notes (Signed)
I met with patient and her husband at bedside. Patient's progression with therapy limited by her headache and nausea. We will follow up Friday to assist in determining if an inpatient rehabilitation stay is needed prior to discharge home vs home with home health or outpatient therapy. Any rehabilitation will have to be  approved by insurance which has limited access over the holiday. Please, call with any questions to pager [phone].

## 2011-01-19 NOTE — Consult Note (Signed)
Physical Medicine and Rehabilitation Consult Reason for Consult:Subarachnoid hemorrhage Referring Phsyician: Dr.Elsner Kaitlin Banks is an 60 y.o. female.   HPI: 60 year old right-handed white female min 01/15/2011 acute onset of headache with nausea vomiting. CT scan of the head demonstrated blood in the basilar cisterns and blood in the fourth ventricle compatible with subarachnoid hemorrhage. CT angiogram of the head and neck were negative. Neurosurgery was consulted Dr. Danielle Dess advise conservative care. Followup cranial CT scan 11/20 with decrease in amount of subarachnoid blood which has shifted position slightly more notable in the left parietal and occipital lobe region no evidence of hydrocephalus. Physical therapy evaluation completed 1120 as patient required hand-held assistance to ambulate 60 feet. Occupational therapy evaluation yet the be completed secondary to patient with nausea and had refused initial therapy. Speech therapy evaluation noted cognition within functional limits for basic testing higher-level cognition yet to be tested. Physical medicine rehabilitation was consulted at the request of physical therapy for consideration of inpatient rehabilitation services.  Review of Systems  Constitutional: Positive for malaise/fatigue.  Eyes: Positive for photophobia. Negative for blurred vision.  Respiratory: Negative for cough and shortness of breath.   Cardiovascular: Negative for chest pain.  Gastrointestinal: Positive for nausea.  Genitourinary: Negative for dysuria.  Musculoskeletal: Positive for myalgias.       HA  Neurological: Positive for dizziness and headaches. Negative for seizures.  Psychiatric/Behavioral: Negative for depression.   Past Medical History  Diagnosis Date  . Migraine headache   . Asthma   . Diabetes mellitus    Past Surgical History  Procedure Date  . Tonsillectomy    History reviewed. No pertinent family history. Social History:  reports that  she has never smoked. She does not have any smokeless tobacco history on file. She reports that she does not drink alcohol or use illicit drugs. Allergies: No Known Allergies Medications Prior to Admission  Medication Dose Route Frequency Provider Last Rate Last Dose  . 0.9 %  sodium chloride infusion   Intravenous Continuous Shary Key Elsner 100 mL/hr at 01/17/11 2014    . acetaminophen (TYLENOL) tablet 650 mg  650 mg Oral Q4H PRN Shary Key Elsner       Or  . acetaminophen (TYLENOL) suppository 650 mg  650 mg Rectal Q4H PRN Stefani Dama      . iohexol (OMNIPAQUE) 350 MG/ML injection 60 mL  60 mL Intravenous Once PRN Medication Radiologist   60 mL at 01/15/11 0821  . labetalol (NORMODYNE,TRANDATE) injection 10-40 mg  10-40 mg Intravenous Q10 min PRN Stefani Dama      . morphine 2 MG/ML injection 1-4 mg  1-4 mg Intravenous Q1H PRN Shary Key Elsner   2 mg at 01/19/11 0316  . morphine 4 MG/ML injection        2 mg at 01/15/11 1331  . niMODipine (NIMOTOP) capsule 60 mg  60 mg Oral Q4H Henry J Elsner   60 mg at 01/16/11 2215   Or  . niMODipine (NIMOTOP) 30 mg/mL oral solution 60 mg  60 mg Per Tube Q4H Henry J Elsner   60 mg at 01/19/11 0653  . ondansetron (ZOFRAN) injection 4 mg  4 mg Intravenous Q6H PRN Shary Key Elsner   4 mg at 01/18/11 2101  . pantoprazole sodium (PROTONIX) 40 mg/20 mL oral suspension 40 mg  40 mg Per Tube Q1200 Kendra P Hiatt, PHARMD      . senna-docusate (Senokot-S) tablet 1 tablet  1 tablet Oral BID Stefani Dama  1 tablet at 01/18/11 2201  . DISCONTD: fentaNYL (SUBLIMAZE) injection 50 mcg  50 mcg Intravenous Once Hurman Horn, MD      . DISCONTD: ondansetron [facility]) injection 4 mg  4 mg Intravenous Once Hurman Horn, MD      . DISCONTD: pantoprazole (PROTONIX) injection 40 mg  40 mg Intravenous QHS Shary Key Elsner   40 mg at 01/17/11 2203   No current outpatient prescriptions on file as of 01/19/2011.    Home: Home Living Lives With: Spouse Receives Help From: Family  (spouse is retired) Type of Home: House Home Layout: One level Home Access: Stairs to enter Secretary/administrator of Steps: 1 Bathroom Shower/Tub: Tub/shower unit;Walk-in shower;Door [employer] Toilet: Standard Bathroom Accessibility: No Hotel manager with walk-in shower not accessible with RW.) Home Adaptive Equipment: None  Functional History: Prior Function Level of Independence: Independent with basic ADLs;Independent with homemaking with ambulation Able to Take Stairs?: Reciprically Driving: Yes Vocation: Full time employment 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 Functional Status:  Mobility: Bed Mobility Bed Mobility: Yes Rolling Right: 5: Supervision;With rail Rolling Right Details (indicate cue type and reason): Supervision for lines (in ICU) Rolling Left: 4: Min assist Right Sidelying to Sit: 5: Supervision;HOB elevated (comment degrees);With rails Right Sidelying to Sit Details (indicate cue type and reason): HOB 20; supervision due to lines Left Sidelying to Sit: 4: Min assist;HOB elevated (comment degrees);With rails (~20) Sitting - Scoot to Edge of Bed: 5: Supervision Sitting - Scoot to Edge of Bed Details (indicate cue type and reason): for safety Sit to Supine - Left: 4: Min assist;With rail;HOB flat Transfers Sit to Stand: 4: Min assist;With upper extremity assist;With armrests;From bed;From chair/3-in-1 Sit to Stand Details (indicate cue type and reason): transfer x 3 (bed, 3n1, chair); minguard A due to pt fearful of becoming dizzy Stand to Sit: 4: Min assist;With upper extremity assist;With armrests;To bed;To chair/3-in-1 Stand to Sit Details: minguard assist Ambulation/Gait Ambulation/Gait: Yes Ambulation/Gait Assistance: 4: Min assist Ambulation/Gait Assistance Details (indicate cue type and reason): second person for IV and lines Ambulation Distance (Feet): 60 Feet Assistive device: 1 person hand held assist;Other  (Comment) (at times holding IV pole with other hand) Gait Pattern: Decreased step length - right;Decreased step length - left Gait velocity: very limited Stairs: No Wheelchair Mobility Wheelchair Mobility: No  ADL: ADL Toilet Transfer: Performed;Minimal Dentist Details (indicate cue type and reason): pt with difficulty keeping eyes open secondary to HA pain Toilet Transfer Method: Stand pivot Acupuncturist: Set designer - Clothing Manipulation: Performed;Minimal assistance Where Assessed - Glass blower/designer Manipulation: Standing Toileting - Hygiene: Performed;Set up;Minimal assistance (assist required to maintain standing balance) Where Assessed - Toileting Hygiene: Standing ADL Comments: limited eval secondary to patient HA pain  Cognition: Cognition Overall Cognitive Status: Appears within functional limits for tasks assessed Arousal/Alertness: Awake/alert Orientation Level: Oriented X4 Attention: Focused;Sustained;Selective Focused Attention: Appears intact Sustained Attention: Appears intact Selective Attention: Appears intact Memory: Appears intact Awareness: Appears intact Problem Solving: Appears intact Executive Function: Reasoning;Decision Making;Organizing;Initiating Reasoning: Appears intact Organizing: Appears intact Decision Making: Appears intact Initiating: Appears intact Safety/Judgment: Appears intact Cognition Arousal/Alertness: Awake/alert Overall Cognitive Status: Appears within functional limits for tasks assessed Orientation Level: Oriented X4 Cognition - Other Comments: requires max encouragement; self-limiting behaviors  Blood pressure 134/61, pulse 62, temperature 98.4 F (36.9 C), temperature source Oral, resp. rate 13, height 5\' 4"  (1.626 m), weight 67.4 kg (148 lb 9.4 oz), SpO2 99.00%. Physical Exam  Constitutional: She  appears well-developed.  HENT:  Head: Normocephalic.  Eyes: Pupils are  equal, round, and reactive to light.  Neck: Normal range of motion. Neck supple. No thyromegaly present.  Cardiovascular: Normal rate and regular rhythm.   No murmur heard. Pulmonary/Chest: Effort normal. She has no wheezes.  Abdominal: Soft. She exhibits no distension. There is no tenderness.  Musculoskeletal: Normal range of motion.  Neurological:       Flat affect.Kept eyes closed during exam.Names person,place and year.    No results found for this or any previous visit (from the past 24 hour(s)). Ct Head Wo Contrast  01/18/2011  *RADIOLOGY REPORT*  Clinical Data: Followup subarachnoid hemorrhage.  Rule out hydrocephalus.  CT HEAD WITHOUT CONTRAST  Technique:  Contiguous axial images were obtained from the base of the skull through the vertex without contrast.  Comparison: 01/15/2011.  Findings: There is been decrease in the amount of subarachnoid blood which has shifted position slightly.  Subarachnoid blood remains.  No interval development of hydrocephalus or CT evidence of large acute infarct.  No intracranial mass lesion detected on this unenhanced exam.  Polypoid opacification left maxillary sinus.  Minimal mucosal thickening right maxillary sinus and mild mucosal thickening ethmoid sinus air cells.  IMPRESSION: Decrease in amount of subarachnoid blood which has shifted position slightly (more notable in the left parietal - occipital lobe region).  No evidence of hydrocephalus or CT findings of large acute infarct.  Original Report Authenticated By: Fuller Canada, M.D.    Assessment/Plan: Diagnosis: Subarachnoid hemorrhage with gait disorder and decrease function  1. Does the need for close, 24 hr/day medical supervision in concert with the patient's rehab needs make it unreasonable for this patient to be served in a less intensive setting? Yes 2. Co-Morbidities requiring supervision/potential complications: headache , nausea, constipation, DM 3. Due to bladder management, bowel  management, safety, skin/wound care, disease management, medication administration, pain management and patient education, does the patient require 24 hr/day rehab nursing? Yes 4. Does the patient require coordinated care of a physician, rehab nurse, PT (1-2 hrs/day, 5 days/week), OT and SLP to address physical and functional deficits in the context of the above medical diagnosis(es)? Yes Addressing deficits in the following areas: balance, endurance, locomotion and strength 5. Can the patient actively participate in an intensive therapy program of at least 3 hrs of therapy per day at least 5 days per week? Yes 6. The potential for patient to make measurable gains while on inpatient rehab is excellent 7. Anticipated functional outcomes upon discharge from inpatients are S/Mod I PT, S/ModI OT, S/Mod I cognitionSLP 8. Estimated rehab length of stay to reach the above functional goals is: 10day 9. Does the patient have adequate social supports to accommodate these discharge functional goals? Yes 10. Anticipated D/C setting: Home 11. Anticipated post D/C treatments: Outpt therapy 12. Overall Rehab/Functional Prognosis: excellent  RECOMMENDATIONS: This patient's condition is appropriate for continued rehabilitative care in the following setting: CIR Patient has agreed to participate in recommended program. Yes Note that insurance prior authorization may be required for reimbursement for recommended care.  Comment:   ANGIULLI,DANIEL J. 01/19/2011

## 2011-01-20 LAB — GLUCOSE, CAPILLARY: Glucose-Capillary: 106 mg/dL — ABNORMAL HIGH (ref 70–99)

## 2011-01-20 MED ORDER — PROMETHAZINE HCL 25 MG/ML IJ SOLN
12.5000 mg | Freq: Four times a day (QID) | INTRAMUSCULAR | Status: DC | PRN
  Administered 2011-01-20 – 2011-01-23 (×3): 12.5 mg via INTRAVENOUS
  Filled 2011-01-20 (×2): qty 1

## 2011-01-20 NOTE — Progress Notes (Signed)
Subjective: Patient reports Slightly worsening headache this morning. she attributes this to the noise from the IV machine and the floor overnight. She was not it would give her much sleep. She denies any significant nausea or vomiting, she denies any numbness tingling arms or legs.  Objective: Vital signs in last 24 hours: Temp:  [98 F (36.7 C)-98.6 F (37 C)] 98.3 F (36.8 C) (11/22 0949) Pulse Rate:  [49-64] 49  (11/22 0949) Resp:  [12-22] 16  (11/22 0949) BP: (125-159)/(56-80) 149/79 mmHg (11/22 0949) SpO2:  [89 %-97 %] 96 % (11/22 0949) FiO2 (%):  [2 %] 2 % (11/21 2300)  Intake/Output from previous day: 11/21 0701 - 11/22 0700 In: 1400 [I.V.:1400] Out: 500 [Urine:500] Intake/Output this shift:    She is awake she is alert she is oriented x4. Pupils are equal reactive extraocular movements are intact. Strength is 5 out of 5 in her upper and lower 70s with no pronator drift.  Lab Results:  Basename 01/19/11 1050  WBC 9.0  HGB 13.1  HCT 37.5  PLT 285   BMET  Basename 01/19/11 1050  NA 137  K 3.1*  CL 101  CO2 25  GLUCOSE 113*  BUN 10  CREATININE 0.51  CALCIUM 8.6    Studies/Results: No results found.  Assessment/Plan: Will observe throughout the day if the headache worsens we'll repeat a CT scan in the morning. Progressively mobilize and increased by mouth intake.  LOS: 5 days     Kaitlin Banks P 01/20/2011, 10:28 AM

## 2011-01-21 ENCOUNTER — Inpatient Hospital Stay (HOSPITAL_COMMUNITY): Payer: BC Managed Care – PPO

## 2011-01-21 LAB — GLUCOSE, CAPILLARY

## 2011-01-21 MED ORDER — DEXAMETHASONE SODIUM PHOSPHATE 4 MG/ML IJ SOLN
4.0000 mg | Freq: Four times a day (QID) | INTRAMUSCULAR | Status: AC
  Administered 2011-01-21 (×2): 4 mg via INTRAVENOUS
  Filled 2011-01-21 (×2): qty 1

## 2011-01-21 MED ORDER — ACETAMINOPHEN 650 MG RE SUPP: 650.0000 mg | RECTAL | Status: DC | PRN

## 2011-01-21 MED ORDER — ACETAMINOPHEN 160 MG/5ML PO SOLN
650.0000 mg | ORAL | Status: DC | PRN
  Administered 2011-01-21 – 2011-01-23 (×3): 650 mg via ORAL
  Filled 2011-01-21 (×3): qty 20.3

## 2011-01-21 MED ORDER — ACETAMINOPHEN 325 MG PO TABS
650.0000 mg | ORAL_TABLET | ORAL | Status: DC | PRN
  Filled 2011-01-21 (×2): qty 2

## 2011-01-21 NOTE — Progress Notes (Signed)
CSW received consult for possible SNF. For assessment, please see pt's chart. CSW will continue to follow and assess discharge needs.   Dede Query, MSW, Theresia Majors [phone]

## 2011-01-21 NOTE — Progress Notes (Signed)
Physical Therapy Treatment Patient Details Name: Kaitlin Banks MRN: [id number] DOB: [birth year] Today's Date: 01/21/2011  PT Assessment/Plan  PT - Assessment/Plan Comments on Treatment Session: Pt reports she is still using BSC (with assist)--educated her, spouse, and RN that pt needs to walk to bathroom with assist.  All stated agreement. Discussed with pt anticipate she can d/c home with family by Monday and should not need CIR or SNF if she continues to mobilize with nursing/family. PT Plan: Discharge plan remains appropriate;Frequency remains appropriate PT Frequency: Min 4X/week Follow Up Recommendations: Home health PT Equipment Recommended: None recommended by PT PT Goals  Acute Rehab PT Goals PT Goal: Supine/Side to Sit - Progress: Progressing toward goal PT Transfer Goal: Sit to Stand/Stand to Sit - Progress: Progressing toward goal PT Goal: Ambulate - Progress: Progressing toward goal  PT Treatment Precautions/Restrictions  Precautions Precautions: Fall Required Braces or Orthoses: No Restrictions Weight Bearing Restrictions: No Mobility (including Balance) Bed Mobility Left Sidelying to Sit: 6: Modified independent (Device/Increase time);HOB elevated (comment degrees);With rails (HOB 30; pt impatient and coming to sit before bed flat) Transfers Sit to Stand: 4: Min assist;With upper extremity assist;From bed Sit to Stand Details (indicate cue type and reason): min-guard A for safety and pt fearful of falling Stand to Sit: 4: Min assist;With upper extremity assist;With armrests;To chair/3-in-1 Stand to Sit Details: min-guard A Ambulation/Gait Ambulation/Gait Assistance: 4: Min assist Ambulation/Gait Assistance Details (indicate cue type and reason): pt initially min-guard A, however after ~5 ft felt she needed to hold onto IV pole and after 10 ft also held onto PT Ambulation Distance (Feet): 70 Feet Assistive device: Other (Comment) (IV pole) Gait Pattern:  Shuffle Gait velocity: very limited, shuffles  Posture/Postural Control Posture/Postural Control: No significant limitations (pt reports feels unsteady, no unsteadiness noted) Exercise    End of Session PT - End of Session Equipment Utilized During Treatment: Gait belt Activity Tolerance: Patient limited by pain;Other (comment) (self-limiting) Patient left: in chair;with call bell in reach;with family/visitor present Nurse Communication: Mobility status for ambulation (discussed c pt, spouse, RN need to ambulate at least bid ) General Behavior During Session: Flat affect Cognition: WFL for tasks performed  Kaitlin Banks 01/21/2011, 3:03 PM Pager [phone]

## 2011-01-21 NOTE — Progress Notes (Signed)
Subjective: Patient reports that she is doing much better today. She has less headache, less nausea was tolerating breakfast and sat up in a chair for a while.  Objective: Vital signs in last 24 hours: Temp:  [98.1 F (36.7 C)-100.1 F (37.8 C)] 98.1 F (36.7 C) (11/23 0957) Pulse Rate:  [55-84] 64  (11/23 0957) Resp:  [16-24] 17  (11/23 0957) BP: (116-165)/(73-78) 157/73 mmHg (11/23 0957) SpO2:  [93 %-98 %] 95 % (11/23 0957)  Intake/Output from previous day:   Intake/Output this shift:    Patient is awake alert and oriented x4 this more. Strength is 5 out of 5 but no evidence of pronator drift. Pupils are equal round and reactive to light.  Lab Results:  Basename 01/19/11 1050  WBC 9.0  HGB 13.1  HCT 37.5  PLT 285   BMET  Basename 01/19/11 1050  NA 137  K 3.1*  CL 101  CO2 25  GLUCOSE 113*  BUN 10  CREATININE 0.51  CALCIUM 8.6    Studies/Results: Ct Head Wo Contrast  01/21/2011  *RADIOLOGY REPORT*  Clinical Data: Headache. Subarachnoid hemorrhage.  CT HEAD WITHOUT CONTRAST  Technique:  Contiguous axial images were obtained from the base of the skull through the vertex without contrast.  Comparison: 01/18/2011  Findings: The subarachnoid hemorrhage in the suprasellar cistern and interpeduncular notch shown on the prior exam is no longer observable. No hemorrhage in the lateral ventricles is currently observed.  No hydrocephalus noted.  Previously there was a suggestion of a small amount of subarachnoid hemorrhage in a left parieto-occipital sulcus; this is indeterminate on image 20 through 21 of series 2 and may still be present, although the appearance could also be due to beam hardening or motion artifact given its subtlety.  Chronic ethmoid and maxillary sinusitis is present.  No acute CVA, mass lesion, or new hemorrhage is observed.  IMPRESSION:  1.  Near-complete resolution of the subarachnoid hemorrhage, with no hemorrhage currently appreciable in the basilar  cisterns or ventricular system.  There is equivocal residual subarachnoid hemorrhage in the left parieto-occipital region. 2.  Chronic maxillary and ethmoid sinusitis.  Original Report Authenticated By: Dellia Cloud, M.D.    Assessment/Plan: Patient is post-bleed day 6 from a subarachnoid hemorrhage angiographically negative. Making slow improvements and progressively mobilizing. Followup ski CT scan today showed no hydrocephalus no significant cerebral edema. We'll continue to mobilize we'll give her a couple doses of Decadron to help with her meningismus.  LOS: 6 days     Sandra Tellefsen P 01/21/2011, 11:29 AM

## 2011-01-21 NOTE — Progress Notes (Signed)
Patient continues with a headache.  She was able to sit on side of bed and eat some breakfast.  Husband says he would like inpatient rehab.  Insurance carriers are closed until Monday.  Will see how she progresses and re-assess on Monday for inpatient rehab vs home with [facility] therapies.  Pager [phone]

## 2011-01-22 LAB — GLUCOSE, CAPILLARY
Glucose-Capillary: 149 mg/dL — ABNORMAL HIGH (ref 70–99)
Glucose-Capillary: 119 mg/dL — ABNORMAL HIGH (ref 70–99)

## 2011-01-22 MED ORDER — DEXAMETHASONE SODIUM PHOSPHATE 4 MG/ML IJ SOLN
4.0000 mg | Freq: Four times a day (QID) | INTRAMUSCULAR | Status: AC
  Administered 2011-01-22 (×3): 4 mg via INTRAVENOUS
  Filled 2011-01-22 (×3): qty 1

## 2011-01-22 NOTE — Progress Notes (Signed)
Occupational Therapy Treatment Patient Details Name: Kaitlin Banks MRN: [id number] DOB: Apr 24, 1950 Today's Date: 01/22/2011  OT Assessment/Plan OT Assessment/Plan Comments on Treatment Session: Pt agreeable to participating in therapy session. Pt and spouse request recommendation for 3-in-1 for d/c home. OT Plan: Discharge plan remains appropriate OT Frequency: Min 2X/week Follow Up Recommendations: Home health OT Equipment Recommended: 3 in 1 bedside comode OT Goals ADL Goals ADL Goal: Grooming - Progress: Progressing toward goals ADL Goal: Toilet Transfer - Progress: Progressing toward goals ADL Goal: Toileting - Clothing Manipulation - Progress: Progressing toward goals ADL Goal: Toileting - Hygiene - Progress: Progressing toward goals  OT Treatment Precautions/Restrictions  Precautions Precautions: Fall Required Braces or Orthoses: No Restrictions Weight Bearing Restrictions: No   ADL ADL Eating/Feeding: Performed;Supervision/safety Eating/Feeding Details (indicate cue type and reason): Supervision for safety sitting EOB Where Assessed - Eating/Feeding: Edge of bed Grooming: Performed;Wash/dry hands;Supervision/safety Where Assessed - Grooming: Standing at sink Toilet Transfer: Performed;Minimal Dentist Details (indicate cue type and reason): Hand held assist to R hand and pt's L UE supported on IV Toilet Transfer Method: Proofreader: Raised toilet seat with arms (or 3-in-1 over toilet) Toileting - Clothing Manipulation: Performed;Supervision/safety Toileting - Clothing Manipulation Details (indicate cue type and reason): Pt pulled gown up while standing before performing stand to sit on 3-in-1 Where Assessed - Glass blower/designer Manipulation: Standing Toileting - Hygiene: Performed;Supervision/safety Where Assessed - Toileting Hygiene: Sit on 3-in-1 or toilet ADL Comments: Pt required increased time for toilet transfer and  toileting task due to HA pain and min assist for steadying Mobility  Bed Mobility Bed Mobility: Yes Supine to Sit: 5: Supervision;HOB elevated (Comment degrees);With rails (55) Sitting - Scoot to Edge of Bed: 5: Supervision Sitting - Scoot to Edge of Bed Details (indicate cue type and reason): for safety Transfers Transfers: Yes Sit to Stand: 4: Min assist;With upper extremity assist;From bed;From chair/3-in-1 Sit to Stand Details (indicate cue type and reason): min guard A for safety and VC for hand placement Stand to Sit: 4: Min assist;With upper extremity assist;To bed;To chair/3-in-1 Stand to Sit Details: min-guard A Exercises    End of Session OT - End of Session Equipment Utilized During Treatment: Gait belt Activity Tolerance: Patient limited by pain Patient left: in bed;with call bell in reach;with family/visitor present General Behavior During Session: [facility] for tasks performed Cognition: [facility] for tasks performed  Cipriano Mile  01/22/2011, 12:57 PM 01/22/2011 Cipriano Mile OTR/L Pager [phone] Office [phone]

## 2011-01-22 NOTE — Progress Notes (Signed)
Covering Clinical Child psychotherapist (CSW) met with pt, pt husband and brother in pt room and provided bed offers to skilled nursing facility in the case that pt is unable to go to CIR. At the moment only [facility] skilled nursing facility has provided placement. Pt and family were appreciative of assistance and informed CSW that they will be informed on Monday whether pt can go to CIR. CSW provided pt husband with CSW contact information. CSW will continue to follow. Theresia Bough, MSW, Theresia Majors [phone]

## 2011-01-22 NOTE — Progress Notes (Signed)
Subjective:  The patient is alert and pleasant. She complains of headache. She says the Decadron helped.  Objective: Vital signs in last 24 hours: Temp:  [97.6 F (36.4 C)-98.7 F (37.1 C)] 97.8 F (36.6 C) (11/24 0946) Pulse Rate:  [58-73] 73  (11/24 0946) Resp:  [17-20] 17  (11/24 0946) BP: (104-134)/(64-83) 114/68 mmHg (11/24 0946) SpO2:  [92 %-98 %] 98 % (11/24 0946)  Intake/Output from previous day: 11/23 0701 - 11/24 0700 In: 2997.5 [I.V.:2997.5] Out: -  Intake/Output this shift:    Physical exam patient is alert and oriented. She is pleasant and in no apparent distress. She is moving all 4 extremities well.  Lab Results:  Basename 01/19/11 1050  WBC 9.0  HGB 13.1  HCT 37.5  PLT 285   BMET  Basename 01/19/11 1050  NA 137  K 3.1*  CL 101  CO2 25  GLUCOSE 113*  BUN 10  CREATININE 0.51  CALCIUM 8.6    Studies/Results: Ct Head Wo Contrast  01/21/2011  *RADIOLOGY REPORT*  Clinical Data: Headache. Subarachnoid hemorrhage.  CT HEAD WITHOUT CONTRAST  Technique:  Contiguous axial images were obtained from the base of the skull through the vertex without contrast.  Comparison: 01/18/2011  Findings: The subarachnoid hemorrhage in the suprasellar cistern and interpeduncular notch shown on the prior exam is no longer observable. No hemorrhage in the lateral ventricles is currently observed.  No hydrocephalus noted.  Previously there was a suggestion of a small amount of subarachnoid hemorrhage in a left parieto-occipital sulcus; this is indeterminate on image 20 through 21 of series 2 and may still be present, although the appearance could also be due to beam hardening or motion artifact given its subtlety.  Chronic ethmoid and maxillary sinusitis is present.  No acute CVA, mass lesion, or new hemorrhage is observed.  IMPRESSION:  1.  Near-complete resolution of the subarachnoid hemorrhage, with no hemorrhage currently appreciable in the basilar cisterns or ventricular system.   There is equivocal residual subarachnoid hemorrhage in the left parieto-occipital region. 2.  Chronic maxillary and ethmoid sinusitis.  Original Report Authenticated By: Dellia Cloud, M.D.    Assessment/Plan: Arteriogram negative subarachnoid hemorrhage: The patient is progressing well. I'll add Decadron.  LOS: 7 days     Shacora Zynda D 01/22/2011, 10:38 AM

## 2011-01-23 ENCOUNTER — Encounter (HOSPITAL_COMMUNITY): Payer: Self-pay | Admitting: Radiology

## 2011-01-23 ENCOUNTER — Inpatient Hospital Stay (HOSPITAL_COMMUNITY): Payer: BC Managed Care – PPO

## 2011-01-23 LAB — GLUCOSE, CAPILLARY
Glucose-Capillary: 101 mg/dL — ABNORMAL HIGH (ref 70–99)
Glucose-Capillary: 105 mg/dL — ABNORMAL HIGH (ref 70–99)

## 2011-01-23 MED ORDER — HYDROCODONE-ACETAMINOPHEN 7.5-500 MG/15ML PO SOLN
10.0000 mg | ORAL | Status: DC | PRN
  Administered 2011-01-23 – 2011-01-28 (×12): 20 mL via ORAL
  Administered 2011-01-28 (×2): 15 mL via ORAL
  Administered 2011-01-28 – 2011-01-30 (×6): 20 mL via ORAL
  Administered 2011-01-30 (×3): 15 mL via ORAL
  Administered 2011-01-30: 20 mL via ORAL
  Administered 2011-01-31: 15 mL via ORAL
  Administered 2011-01-31: 20 mL via ORAL
  Administered 2011-01-31: 15 mL via ORAL
  Administered 2011-01-31: 20 mL via ORAL
  Administered 2011-01-31: 15 mL via ORAL
  Administered 2011-02-01: 20 mL via ORAL
  Administered 2011-02-01: 15 mL via ORAL
  Administered 2011-02-01: 10 mg via ORAL
  Administered 2011-02-02: 10:00:00 via ORAL
  Administered 2011-02-02: 20 mL via ORAL
  Administered 2011-02-03: 15 mL via ORAL
  Administered 2011-02-03 – 2011-02-04 (×2): 20 mL via ORAL
  Administered 2011-02-04: 15 mL via ORAL
  Filled 2011-01-23 (×3): qty 15
  Filled 2011-01-23 (×2): qty 30
  Filled 2011-01-23 (×8): qty 15
  Filled 2011-01-23 (×4): qty 30
  Filled 2011-01-23 (×4): qty 15
  Filled 2011-01-23: qty 30
  Filled 2011-01-23 (×2): qty 15
  Filled 2011-01-23: qty 30
  Filled 2011-01-23 (×2): qty 15
  Filled 2011-01-23 (×2): qty 30
  Filled 2011-01-23 (×4): qty 15
  Filled 2011-01-23: qty 30
  Filled 2011-01-23 (×4): qty 15
  Filled 2011-01-23 (×2): qty 30
  Filled 2011-01-23: qty 15
  Filled 2011-01-23: qty 30
  Filled 2011-01-23: qty 15
  Filled 2011-01-23: qty 30
  Filled 2011-01-23 (×3): qty 15

## 2011-01-23 NOTE — Progress Notes (Signed)
Patient ID: Kaitlin Banks, female   DOB: 06/17/1950, 60 y.o.   MRN: [id number] Subjective:  The patient is alert and pleasant. She continues to complain of headaches. She says they have not worsened nor improved. She is accompanied by her husband.  Objective: Vital signs in last 24 hours: Temp:  [97.5 F (36.4 C)-98.2 F (36.8 C)] 97.5 F (36.4 C) (11/25 0541) Pulse Rate:  [46-64] 54  (11/25 0541) Resp:  [16-20] 16  (11/25 0541) BP: (115-164)/(66-83) 160/73 mmHg (11/25 0541) SpO2:  [94 %-98 %] 96 % (11/25 0541)  Intake/Output from previous day: 11/24 0701 - 11/25 0700 In: 1787.2 [P.O.:600; I.V.:1179.2; IV Piggyback:8] Out: 1 [Urine:1] Intake/Output this shift:    Physical exam the patient is alert and oriented x3. Her pupils are equal round reactive to light. She is moving all 4 extremities well. Imaging studies: I reviewed the patient's head CT performed without contrast 01/23/2011 at [facility]. It demonstrates no change from her prior study of 01/21/2011. There remains some fullness/clot in the ambient cistern. Lab Results: No results found for this basename: WBC:2,HGB:2,HCT:2,PLT:2 in the last 72 hours BMET No results found for this basename: NA:2,K:2,CL:2,CO2:2,GLUCOSE:2,BUN:2,CREATININE:2,CALCIUM:2 in the last 72 hours  Studies/Results: Ct Head Wo Contrast  01/23/2011  *RADIOLOGY REPORT*  Clinical Data: Subarachnoid hemorrhage 01/15/2011.  Improving but sudden onset of headache.  CT HEAD WITHOUT CONTRAST  Technique:  Contiguous axial images were obtained from the base of the skull through the vertex without contrast.  Comparison: Most recent examination 01/21/2011.  Findings: Tiny areas of subarachnoid blood superior convexity (series 2 image 25) left frontal lobe (series 2 image 23), left parietal lobe (series 2 image 19) and a subtle blood clot right paracentral ventral aspect of the pons (series 2 images 7 and 8). Findings were noted previously without definitive  findings of new subarachnoid hemorrhage.  Given the fact the patient had prominent subarachnoid hemorrhage 01/15/2011 and is presenting with new headache, it is possible that this represents potential evidence of underlying aneurysm not detected on CT angiogram.  No hydrocephalus.  No CT evidence of large acute infarct.  Small acute infarct cannot be excluded by CT.  No intracranial mass lesion detected on this unenhanced exam.  Polypoid opacification left maxillary sinus.  IMPRESSION: Tiny areas of subarachnoid blood do not appear significantly different than the prior exam. No definitive findings of new intracranial hemorrhage.  Please see above discussion.  Critical Value/emergent results were called by telephone at the time of interpretation on 01/23/2011  at 9:00 a.m.  to  Morrell Riddle the patient's nurse,, who verbally acknowledged these results.  Original Report Authenticated By: Fuller Canada, M.D.    Assessment/Plan: Subarachnoid hemorrhage: I discussed situation with the patient and her husband. We will continue to treat her headache symptomatically. She will need to get a followup imaging study in the future to again rule out intracranial aneurysm. I discussed with the patient and her husband and answered all her questions.  LOS: 8 days     Fallou Hulbert D 01/23/2011, 10:10 AM

## 2011-01-23 NOTE — Progress Notes (Signed)
OT Cancellation Note   Treatment cancelled today due to patient sleeping soundly.  OT was greeted by patient husband, who asked OT to come back another time.  OT will check back for tx as time allows.  Thank you,  01/23/2011 Logon Uttech K. Beatrice-Mitchell, MS, OTR/L Occupational Therapist Acute Rehabilitation [place] Phone: [phone] Pager: [phone] [email or web address]

## 2011-01-23 NOTE — Progress Notes (Signed)
Pt was complaining of severe headache at 0345 after receiving 4 mg of morphine and tylenol 650. BP was 164/83 and HR 46.Talked to Doctor oncall and received and order for Hydrocodone 10 mg PO Q4 PRN for pain.

## 2011-01-23 NOTE — Progress Notes (Signed)
Patient nauseated majority of the day.  Quiet environment and minimal stimulation.  Patient still report head pain.  IV Pain medication administered, patient had emesis X3.  Medication provided per order.  Assisted with mouth care. Patient resting quietly.  Will continue to monitor.  Osvaldo Angst, RN

## 2011-01-24 LAB — GLUCOSE, CAPILLARY
Glucose-Capillary: 94 mg/dL (ref 70–99)
Glucose-Capillary: 106 mg/dL — ABNORMAL HIGH (ref 70–99)
Glucose-Capillary: 87 mg/dL (ref 70–99)

## 2011-01-24 NOTE — H&P (Deleted)
Kaitlin Banks is an 60 y.o. female.   Chief Complaint: Cerebral angiogram ordered for further evaluation of recent Subarachnoid Hemorrhage HPI: 60 yo female with recent Subarachnoid Hemorrhage. CT angiogram performed at time of admission did not show bleeding source. Now scheduled for cerebral angiogram.  Past Medical History  Diagnosis Date  . Migraine headache   . Asthma   . Diabetes mellitus     Past Surgical History  Procedure Date  . Tonsillectomy     History reviewed. No pertinent family history. Social History:  reports that she has never smoked. She does not have any smokeless tobacco history on file. She reports that she does not drink alcohol or use illicit drugs.  Allergies: No Known Allergies  Medications Prior to Admission  Medication Dose Route Frequency Provider Last Rate Last Dose  . 0.9 % NaCl with KCl 20 mEq/ L  infusion   Intravenous Continuous Shary Key Elsner 50 mL/hr at 01/24/11 0432    . acetaminophen (TYLENOL) solution 650 mg  650 mg Oral Q4H PRN Shary Key Elsner   650 mg at 01/23/11 9147   Or  . acetaminophen (TYLENOL) tablet 650 mg  650 mg Oral Q4H PRN Shary Key Elsner       Or  . acetaminophen (TYLENOL) suppository 650 mg  650 mg Rectal Q4H PRN Stefani Dama      . dexamethasone (DECADRON) injection 4 mg  4 mg Intravenous Q6H Gary P Cram   4 mg at 01/21/11 1756  . dexamethasone (DECADRON) injection 4 mg  4 mg Intravenous Q6H Duane Lope Jenkins   4 mg at 01/22/11 2352  . HYDROcodone-acetaminophen (LORTAB) 7.5-500 MG/15ML solution 20 mL  10 mg Oral Q4H PRN Cristi Loron   20 mL at 01/24/11 1245  . iohexol (OMNIPAQUE) 350 MG/ML injection 60 mL  60 mL Intravenous Once PRN Medication Radiologist   60 mL at 01/15/11 0821  . labetalol (NORMODYNE,TRANDATE) injection 10-40 mg  10-40 mg Intravenous Q10 min PRN Stefani Dama      . morphine 2 MG/ML injection 1-4 mg  1-4 mg Intravenous Q1H PRN Shary Key Elsner   2 mg at 01/24/11 1038  . morphine 4 MG/ML injection         2 mg at 01/15/11 1331  . niMODipine (NIMOTOP) 30 mg/mL oral solution 30 mg  30 mg Oral Q4H Ernesto M Botero   30 mg at 01/24/11 1245  . ondansetron (ZOFRAN) injection 4 mg  4 mg Intravenous Q6H PRN Shary Key Elsner   4 mg at 01/24/11 1039  . pantoprazole sodium (PROTONIX) 40 mg/20 mL oral suspension 40 mg  40 mg Per Tube Q1200 Kendra P Hiatt, PHARMD   40 mg at 01/24/11 1245  . potassium chloride 20 MEQ/15ML (10%) liquid 20 mEq  20 mEq Oral BID Tanya Nones Botero   20 mEq at 01/24/11 1017  . promethazine (PHENERGAN) injection 12.5 mg  12.5 mg Intravenous Q6H PRN Donzetta Sprung Cram   12.5 mg at 01/23/11 1530  . senna-docusate (Senokot-S) tablet 1 tablet  1 tablet Oral BID Shary Key Elsner   1 tablet at 01/24/11 1017  . DISCONTD: 0.9 %  sodium chloride infusion   Intravenous Continuous Shary Key Elsner 100 mL/hr at 01/17/11 2014    . DISCONTD: 0.9 %  sodium chloride infusion   Intravenous Continuous Shary Key Elsner 50 mL/hr at 01/19/11 1438    . DISCONTD: acetaminophen (TYLENOL) suppository 650 mg  650 mg Rectal Q4H PRN  Shary Key Elsner      . DISCONTD: acetaminophen (TYLENOL) tablet 650 mg  650 mg Oral Q4H PRN Stefani Dama      . DISCONTD: fentaNYL (SUBLIMAZE) injection 50 mcg  50 mcg Intravenous Once Hurman Horn, MD      . DISCONTD: niMODipine (NIMOTOP) 30 mg/mL oral solution 60 mg  60 mg Per Tube Q4H Henry J Elsner   60 mg at 01/19/11 1005  . DISCONTD: niMODipine (NIMOTOP) capsule 30 mg  30 mg Oral Q4H Henry J Elsner   30 mg at 01/19/11 1835  . DISCONTD: niMODipine (NIMOTOP) capsule 60 mg  60 mg Oral Q4H Henry J Elsner   60 mg at 01/16/11 2215  . DISCONTD: ondansetron (ZOFRAN) injection 4 mg  4 mg Intravenous Once Hurman Horn, MD      . DISCONTD: pantoprazole (PROTONIX) injection 40 mg  40 mg Intravenous QHS Shary Key Elsner   40 mg at 01/17/11 2203  . DISCONTD: potassium chloride (KLOR-CON) packet 20 mEq  20 mEq Oral BID Stefani Dama      . DISCONTD: potassium chloride (KLOR-CON) packet 20 mEq  20 mEq Oral  BID Stefani Dama      . DISCONTD: potassium chloride SA (K-DUR,KLOR-CON) CR tablet 20 mEq  20 mEq Oral BID Shary Key Elsner       No current outpatient prescriptions on file as of 01/24/2011.    Results for orders placed during the hospital encounter of 01/15/11 (from the past 48 hour(s))  GLUCOSE, CAPILLARY     Status: Abnormal   Collection Time   01/22/11  4:41 PM      Component Value Range Comment   Glucose-Capillary 120 (*) 70 - 99 (mg/dL)    Comment 1 Notify RN     GLUCOSE, CAPILLARY     Status: Abnormal   Collection Time   01/22/11 11:08 PM      Component Value Range Comment   Glucose-Capillary 150 (*) 70 - 99 (mg/dL)   GLUCOSE, CAPILLARY     Status: Abnormal   Collection Time   01/23/11  5:46 AM      Component Value Range Comment   Glucose-Capillary 157 (*) 70 - 99 (mg/dL)   GLUCOSE, CAPILLARY     Status: Abnormal   Collection Time   01/23/11 11:57 AM      Component Value Range Comment   Glucose-Capillary 110 (*) 70 - 99 (mg/dL)   GLUCOSE, CAPILLARY     Status: Abnormal   Collection Time   01/23/11  4:01 PM      Component Value Range Comment   Glucose-Capillary 101 (*) 70 - 99 (mg/dL)   GLUCOSE, CAPILLARY     Status: Abnormal   Collection Time   01/23/11 10:31 PM      Component Value Range Comment   Glucose-Capillary 105 (*) 70 - 99 (mg/dL)   GLUCOSE, CAPILLARY     Status: Normal   Collection Time   01/24/11  7:04 AM      Component Value Range Comment   Glucose-Capillary 94  70 - 99 (mg/dL)   GLUCOSE, CAPILLARY     Status: Abnormal   Collection Time   01/24/11 11:37 AM      Component Value Range Comment   Glucose-Capillary 106 (*) 70 - 99 (mg/dL)    Ct Head Wo Contrast  01/23/2011  *RADIOLOGY REPORT*  Clinical Data: Subarachnoid hemorrhage 01/15/2011.  Improving but sudden onset of headache.  CT HEAD WITHOUT CONTRAST  Technique:  Contiguous axial images were obtained from the base of the skull through the vertex without contrast.  Comparison: Most recent  examination 01/21/2011.  Findings: Tiny areas of subarachnoid blood superior convexity (series 2 image 25) left frontal lobe (series 2 image 23), left parietal lobe (series 2 image 19) and a subtle blood clot right paracentral ventral aspect of the pons (series 2 images 7 and 8). Findings were noted previously without definitive findings of new subarachnoid hemorrhage.  Given the fact the patient had prominent subarachnoid hemorrhage 01/15/2011 and is presenting with new headache, it is possible that this represents potential evidence of underlying aneurysm not detected on CT angiogram.  No hydrocephalus.  No CT evidence of large acute infarct.  Small acute infarct cannot be excluded by CT.  No intracranial mass lesion detected on this unenhanced exam.  Polypoid opacification left maxillary sinus.  IMPRESSION: Tiny areas of subarachnoid blood do not appear significantly different than the prior exam. No definitive findings of new intracranial hemorrhage.  Please see above discussion.  Critical Value/emergent results were called by telephone at the time of interpretation on 01/23/2011  at 9:00 a.m.  to  Morrell Riddle the patient's nurse,, who verbally acknowledged these results.  Original Report Authenticated By: Fuller Canada, M.D.    ROS See admission H&P. Pt. Has on going headaches.  Blood pressure 145/74, pulse 53, temperature 99 F (37.2 C), temperature source Oral, resp. rate 18, height 5\' 4"  (1.626 m), weight 148 lb 9.4 oz (67.4 kg), SpO2 97.00%. Physical Exam 60 yo female sitting on edge of bed uncomfortable secondary to headache. Heart - RRR Lungs - clear Exts - LUE swollen from previous IV. Lower extremities - bilateral compression device. Pulses intact. Moves all extremities - motor 4/5 throughout. Sensation intact to light touch.  Assessment/Plan Cerebral angiogram explained to patient and husband along with risks and benefits. Informed consent obtained from patient's husband. Angiogram scheduled  for tomorrow to be performed by Dr. Corliss Skains.  Inita Uram 01/24/2011, 1:17 PM

## 2011-01-24 NOTE — Progress Notes (Signed)
Utilization review completed. Aolani Piggott, RN, BSN. 01/24/11  

## 2011-01-24 NOTE — Progress Notes (Signed)
Subjective: Patient reports nausea headache, not feeling as well as on Friday and Saturday.  Objective: Vital signs in last 24 hours: Temp:  [98.4 F (36.9 C)-99 F (37.2 C)] 99 F (37.2 C) (11/26 1015) Pulse Rate:  [52-57] 53  (11/26 1015) Resp:  [18-20] 18  (11/26 1015) BP: (110-145)/(66-77) 145/74 mmHg (11/26 1015) SpO2:  [91 %-97 %] 97 % (11/26 1015)  Intake/Output from previous day: 11/25 0701 - 11/26 0700 In: 470 [P.O.:470] Out: 1875 [Urine:1875] Intake/Output this shift: Total I/O In: 240 [P.O.:240] Out: -   arouses to voice 2+miningismus, pupils = reactive speech fluent. Moving extremiities well.  Lab Results: No results found for this basename: WBC:2,HGB:2,HCT:2,PLT:2 in the last 72 hours BMET No results found for this basename: NA:2,K:2,CL:2,CO2:2,GLUCOSE:2,BUN:2,CREATININE:2,CALCIUM:2 in the last 72 hours  Studies/Results: Ct Head Wo Contrast  01/23/2011  *RADIOLOGY REPORT*  Clinical Data: Subarachnoid hemorrhage 01/15/2011.  Improving but sudden onset of headache.  CT HEAD WITHOUT CONTRAST  Technique:  Contiguous axial images were obtained from the base of the skull through the vertex without contrast.  Comparison: Most recent examination 01/21/2011.  Findings: Tiny areas of subarachnoid blood superior convexity (series 2 image 25) left frontal lobe (series 2 image 23), left parietal lobe (series 2 image 19) and a subtle blood clot right paracentral ventral aspect of the pons (series 2 images 7 and 8). Findings were noted previously without definitive findings of new subarachnoid hemorrhage.  Given the fact the patient had prominent subarachnoid hemorrhage 01/15/2011 and is presenting with new headache, it is possible that this represents potential evidence of underlying aneurysm not detected on CT angiogram.  No hydrocephalus.  No CT evidence of large acute infarct.  Small acute infarct cannot be excluded by CT.  No intracranial mass lesion detected on this unenhanced  exam.  Polypoid opacification left maxillary sinus.  IMPRESSION: Tiny areas of subarachnoid blood do not appear significantly different than the prior exam. No definitive findings of new intracranial hemorrhage.  Please see above discussion.  Critical Value/emergent results were called by telephone at the time of interpretation on 01/23/2011  at 9:00 a.m.  to  Morrell Riddle the patient's nurse,, who verbally acknowledged these results.  Original Report Authenticated By: Fuller Canada, M.D.    Assessment/Plan:Continue conservative treatment. Fluid support.  LOS: 9 days  Consider cerebral angiogram. CIR   Yuli Lanigan J 01/24/2011, 11:47 AM

## 2011-01-24 NOTE — Progress Notes (Signed)
CSW attempted to meet with pt, but she was sleeping soundly. CSW left a message with pt's husband to inform him of bed offers. CSW will continue to follow.   Dede Query, MSW, Theresia Majors [phone]

## 2011-01-24 NOTE — Progress Notes (Signed)
PT/OT Cancellation note:   PT/OT session deferred this afternoon per RN request due to pt just received pain medication & is resting.  RN requested therapy not to go into room.    Verdell Face, [place] [phone]  Tommy Medal, COTA/L

## 2011-01-24 NOTE — Progress Notes (Signed)
Patient continues to be limited by her headache and nausea Saturday night and Sunday. I await further progress with therapy hopefully today, so I can pursue insurance approval for a CIR/ inpatient acute rehabilitation admission before discharge home with her husband. I discussed with patient and her spouse at bedside. Please, call with any questions. Pager [phone].

## 2011-01-24 NOTE — Progress Notes (Signed)
OT Cancellation Note  Treatment cancelled today due to patient's refusal to participate: pt. Sitting EOB upon arrival taking meds and states "oh no, i'm not going to be able to do therapy today". Pts. Family present and told me how dizzy and nauseas pt. Has been and that they also did not feel that she could participate.  Tommy Medal, COTA/L

## 2011-01-25 ENCOUNTER — Inpatient Hospital Stay (HOSPITAL_COMMUNITY): Payer: BC Managed Care – PPO

## 2011-01-25 LAB — CBC
HCT: 41.9 % (ref 36.0–46.0)
Hemoglobin: 14.3 g/dL (ref 12.0–15.0)
MCH: 33.3 pg (ref 26.0–34.0)
MCV: 97.7 fL (ref 78.0–100.0)
Platelets: 431 10*3/uL — ABNORMAL HIGH (ref 150–400)
RBC: 4.29 MIL/uL (ref 3.87–5.11)
WBC: 8 10*3/uL (ref 4.0–10.5)

## 2011-01-25 LAB — DIFFERENTIAL
Eosinophils Absolute: 0.2 10*3/uL (ref 0.0–0.7)
Eosinophils Relative: 2 % (ref 0–5)
Lymphocytes Relative: 25 % (ref 12–46)
Lymphs Abs: 2 10*3/uL (ref 0.7–4.0)
Monocytes Relative: 8 % (ref 3–12)

## 2011-01-25 LAB — BASIC METABOLIC PANEL
BUN: 8 mg/dL (ref 6–23)
CO2: 26 mEq/L (ref 19–32)
Calcium: 9.2 mg/dL (ref 8.4–10.5)
GFR calc non Af Amer: >90 mL/min (ref >90)
Glucose, Bld: 100 mg/dL — ABNORMAL HIGH (ref 70–99)
Sodium: 135 mEq/L (ref 135–145)

## 2011-01-25 LAB — GLUCOSE, CAPILLARY
Glucose-Capillary: 110 mg/dL — ABNORMAL HIGH (ref 70–99)
Glucose-Capillary: 97 mg/dL (ref 70–99)

## 2011-01-25 MED ORDER — FENTANYL CITRATE 0.05 MG/ML IJ SOLN
INTRAMUSCULAR | Status: DC | PRN
  Administered 2011-01-25: 12.5 ug via INTRAVENOUS

## 2011-01-25 MED ORDER — SODIUM CHLORIDE 0.9 % IV SOLN: INTRAVENOUS | Status: DC

## 2011-01-25 MED ORDER — IOHEXOL 300 MG/ML  SOLN: 150.0000 mL | Freq: Once | INTRAMUSCULAR | Status: AC | PRN

## 2011-01-25 MED ORDER — MIDAZOLAM HCL 5 MG/5ML IJ SOLN
INTRAMUSCULAR | Status: DC | PRN
Start: 2011-01-25 — End: 2011-01-25
  Administered 2011-01-25: 0.5 mg via INTRAVENOUS

## 2011-01-25 MED ORDER — SODIUM CHLORIDE 0.9 % IV SOLN
INTRAVENOUS | Status: DC | PRN
  Administered 2011-01-25: 20 mL via INTRAVENOUS

## 2011-01-25 NOTE — Progress Notes (Signed)
Occupational Therapy Treatment Patient Details Name: Kaitlin Banks MRN: [id number] DOB: 26-Jul-1950 Today's Date: 01/25/2011    Attempted to see pt this am. Pt stated she didn't think she could participate with OT this am. Will try again later as appropriate.    Cancellation visit  Debroh Sieloff,HILLARY  01/25/2011, 10:12 AM

## 2011-01-25 NOTE — Procedures (Signed)
S/P 4 vessel cerebral arteriogram. Rt CFA approach. No acute complications . Prelim.findings. Moderate vasospasm of prox. PCAs.. No aneurysms seen. Full repot to follow.

## 2011-01-25 NOTE — Progress Notes (Signed)
PT Cancellation note: Attempted to see pt this AM, but deferred due to OT refusal. Reattempted this afternoon, but she is off the floor at a procedure. Will reattempt again as able.  Clydene Laming, [place] [phone]

## 2011-01-26 LAB — GLUCOSE, CAPILLARY
Glucose-Capillary: 100 mg/dL — ABNORMAL HIGH (ref 70–99)
Glucose-Capillary: 96 mg/dL (ref 70–99)

## 2011-01-26 NOTE — Progress Notes (Signed)
Physical Therapy Treatment Patient Details Name: Kaitlin Banks MRN: [id number] DOB: 1950-10-13 Today's Date: 01/26/2011  PT Assessment/Plan  PT - Assessment/Plan Comments on Treatment Session: Pt continues to be llimited by pain and nausea. Pt is at a S level with basic mobility and has good husband support. Do not feel pt needs CIR at this time. Pt needs max encouragement to participate and is limiting herself. Pt  would benefit from ambulation QD by nursing staff and continued PT to improve I.  PT Plan: Discharge plan remains appropriate Follow Up Recommendations: Home health PT Equipment Recommended: Rolling walker with 5" wheels PT Goals  Acute Rehab PT Goals PT Goal: Supine/Side to Sit - Progress: Progressing toward goal PT Transfer Goal: Sit to Stand/Stand to Sit - Progress: Progressing toward goal PT Goal: Ambulate - Progress: Progressing toward goal  PT Treatment Precautions/Restrictions  Precautions Precautions: Fall Required Braces or Orthoses: No Restrictions Weight Bearing Restrictions: No Mobility (including Balance) Bed Mobility Bed Mobility: Yes Rolling Left: 6: Modified independent (Device/Increase time) Left Sidelying to Sit: 5: Supervision;With rails Left Sidelying to Sit Details (indicate cue type and reason): cues for technique for increased independence Supine to Sit: 5: Supervision;With rails Supine to Sit Details (indicate cue type and reason): cues for technique Sitting - Scoot to Edge of Bed: 5: Supervision (pt keeps eyes closed for most of the activity) Transfers Transfers: Yes Sit to Stand: 5: Supervision;From bed;From toilet (elevated seat over toliet) Sit to Stand Details (indicate cue type and reason): cues for hand placement Stand to Sit: 5: Supervision;With upper extremity assist;To chair/3-in-1;To toilet Stand to Sit Details: cues for hand placement Ambulation/Gait Ambulation/Gait: Yes Ambulation/Gait Assistance: 5:  Supervision Ambulation/Gait Assistance Details (indicate cue type and reason): cues for walker management to avoid obsticles and cues to open her eyes Ambulation Distance (Feet): 12 Feet (12' x 2) Assistive device: Rolling walker Gait Pattern: Shuffle;Decreased stride length;Decreased hip/knee flexion - right;Decreased hip/knee flexion - left Gait velocity: slow  Balance Balance Assessed: Yes Dynamic Standing Balance Dynamic Standing - Balance Support: During functional activity (standing at sink holding onto counter bruching teeth) Dynamic Standing - Level of Assistance: 5: Stand by assistance Dynamic Standing - Balance Activities:  (standing while brushing teeth-approx. 5 mins.) Exercise  General Exercises - Lower Extremity Straight Leg Raises: AAROM;Strengthening;Both;10 reps;Supine (ended exercises to go to the bathroom) End of Session PT - End of Session Equipment Utilized During Treatment: Gait belt Activity Tolerance: Patient limited by pain (pt with minimal participation) Patient left: in chair;with call bell in reach;with family/visitor present Nurse Communication: Mobility status for ambulation General Behavior During Session: Flat affect Cognition: WFL for tasks performed  Greggory Stallion 01/26/2011, 12:55 PM

## 2011-01-26 NOTE — Progress Notes (Signed)
OT Cancellation Note  Treatment cancelled today due to patient's refusal to participate: patient states she has not been able to sleep well and would like to rest at this time. Spoke at length with patient and husband regarding the importance of getting out of bed and participating with therapies despite fatigue and nausea. Patient and husband verbalized understanding. Patient states she will attempt to work with OT in am.   Glendale Chard    OTR/L Pager: [phone] 01/26/2011

## 2011-01-26 NOTE — Progress Notes (Signed)
Discussed in the long length of stay meeting Kaitlin Banks Weeks 01/26/2011  

## 2011-01-26 NOTE — Progress Notes (Signed)
Noted that patient was at supervision level with therapy today. Able to walk 12 feet with supervision x 2. At this level patient appropriate to discharge home with home health therapy when medically ready. I informed patient and contacted her spouse by phone and they are aware. Please call me with any questions. Pager [phone].

## 2011-01-26 NOTE — Progress Notes (Signed)
CSW is signing off, as the discharge recommendation is for pt to discharge home with home health PT/OT. Pt was asleep. CSW spoke with pt's husband, and he is agreeable to discharge home with home health services. CSW will update RNCM. No further clinical social work needs identified. Please reconsult CSW if a need arises prior to discharge.   Dede Query, MSW, Theresia Majors [phone]

## 2011-01-27 LAB — GLUCOSE, CAPILLARY: Glucose-Capillary: 99 mg/dL (ref 70–99)

## 2011-01-27 NOTE — Progress Notes (Signed)
Physical Therapy Treatment Patient Details Name: Kaitlin Banks MRN: [id number] DOB: Jan 15, 1951 Today's Date: 01/27/2011  PT Assessment/Plan  PT - Assessment/Plan Comments on Treatment Session: Pt requires strong encouragement to participate & increase activity level.  Moves well.  Pt very lethargic/drowsy- keeping eyes closed majority of session however more alert when ambulating but still requiring cues to keep eyes open.  RN states drowsiness may be due to medications.  Recommend pt ambulate in halls 2-3x's day with nursing but allow bed<>bathroom with husband only if pt alert.   PT Plan: Discharge plan remains appropriate PT Frequency: Min 4X/week Follow Up Recommendations: Home health PT Equipment Recommended: Rolling walker with 5" wheels PT Goals  Acute Rehab PT Goals PT Goal: Supine/Side to Sit - Progress: Met PT Goal: Sit to Supine/Side - Progress: Met PT Transfer Goal: Sit to Stand/Stand to Sit - Progress: Met PT Goal: Ambulate - Progress: Progressing toward goal  PT Treatment Precautions/Restrictions  Precautions Precautions: Fall Required Braces or Orthoses: No Restrictions Weight Bearing Restrictions: No Mobility (including Balance) Bed Mobility Supine to Sit: 6: Modified independent (Device/Increase time) Sit to Supine - Left: 6: Modified independent (Device/Increase time);HOB flat Transfers Sit to Stand: 5: Supervision;From bed;With upper extremity assist Sit to Stand Details (indicate cue type and reason): cues for hand placement & for safety due to lethargy/drowsiness Stand to Sit: 5: Supervision;With upper extremity assist;Without upper extremity assist;To bed Stand to Sit Details: cues for hand placement, body positioning before sitting,  Ambulation/Gait Ambulation/Gait Assistance: Other (comment) (Min Guard (A)) Ambulation/Gait Assistance Details (indicate cue type and reason): close guarding due to drowsiness/lethargy.   Ambulation Distance (Feet): 50  Feet Assistive device: Rolling walker Gait Pattern: Decreased step length - right;Decreased step length - left;Decreased stride length;Step-through pattern (shuffle-like steps) Gait velocity: slow gait velocity Stairs: No Wheelchair Mobility Wheelchair Mobility: No  Posture/Postural Control Posture/Postural Control: No significant limitations Balance Balance Assessed: No Exercise  Total Joint Exercises Knee Flexion: AROM;Both;10 reps;Seated;Other (comment) (pt not achieving full extension) Marching in Standing: Seated;Both;10 reps;AROM Manual stretching to Bilateral Hamstrings; 3x15 secs End of Session PT - End of Session Equipment Utilized During Treatment: Gait belt Activity Tolerance: Other (comment) (very drowsy/lethargy due to medications per RN) Nurse Communication: Mobility status for ambulation (Pt could be (A)'d to bathroom by husband) General Behavior During Session: Lethargic Cognition: WFL for tasks performed  Lara Mulch 01/27/2011, 12:51 PM [phone]

## 2011-01-27 NOTE — Progress Notes (Signed)
Utilization review completed. Jaquasia Doscher, RN, BSN. 01/27/11  

## 2011-01-28 DIAGNOSIS — I67848 Other cerebrovascular vasospasm and vasoconstriction: Secondary | ICD-10-CM | POA: Clinically undetermined

## 2011-01-28 LAB — GLUCOSE, CAPILLARY
Glucose-Capillary: 100 mg/dL — ABNORMAL HIGH (ref 70–99)
Glucose-Capillary: 98 mg/dL (ref 70–99)
Glucose-Capillary: 100 mg/dL — ABNORMAL HIGH (ref 70–99)

## 2011-01-28 LAB — BASIC METABOLIC PANEL
CO2: 24 mEq/L (ref 19–32)
Glucose, Bld: 144 mg/dL — ABNORMAL HIGH (ref 70–99)
Potassium: 3.8 mEq/L (ref 3.5–5.1)
Sodium: 137 mEq/L (ref 135–145)

## 2011-01-28 NOTE — Progress Notes (Signed)
Physical Therapy Treatment Patient Details Name: Kaitlin Banks MRN: [id number] DOB: 11/22/1950 Today's Date: 01/28/2011  PT Assessment/Plan  PT - Assessment/Plan Comments on Treatment Session: Pt moving well.  Ambulated with bil UE support (RW) progressing to unilateral UE support (IV pole).  Still drowsy, but did not require as much encouragement to participate in therapy.  Did not attempt DGI due to pts drowsiness.   PT Plan: Discharge plan remains appropriate PT Frequency: Min 4X/week Follow Up Recommendations: Home health PT Equipment Recommended: Rolling walker with 5" wheels PT Goals  Acute Rehab PT Goals PT Transfer Goal: Sit to Stand/Stand to Sit - Progress: Met PT Goal: Ambulate - Progress: Met  PT Treatment Precautions/Restrictions  Precautions Precautions: Fall Required Braces or Orthoses: No Restrictions Weight Bearing Restrictions: No Mobility (including Balance) Bed Mobility Bed Mobility: No Transfers Sit to Stand: 6: Modified independent (Device/Increase time) Stand to Sit: 6: Modified independent (Device/Increase time) Ambulation/Gait Ambulation/Gait Assistance: 5: Supervision;Other (comment) (Min Guard (A)) Ambulation/Gait Assistance Details (indicate cue type and reason): Started off with RW Bil UE support then progressed to Unilateral UE support pushing IV pole.  Supervision due to lethargy/drowsiness.  Ambulated 80' with RW then progressed to pushing IV pole 200'.  Cues to increased step/stride length, keep eyes open, attempt to increase gait speed if tolerable.  Pt c/o of hip pain./discomfort.   No physical (A) needed.   Ambulation Distance (Feet): 275 Feet Assistive device: Rolling walker (IV pole) Gait Pattern: Step-through pattern;Decreased step length - right;Decreased step length - left;Decreased stride length Gait velocity: slow gait velocity.      Exercise  General Exercises - Lower Extremity Hip ABduction/ADduction: AROM;Both;10 reps;Standing Hip  Flexion/Marching: AROM;Both;10 reps;Standing Heel Raises: AROM;10 reps;Both;Standing Mini-Sqauts: Strengthening;10 reps;Standing End of Session PT - End of Session Equipment Utilized During Treatment: Gait belt Activity Tolerance: Patient tolerated treatment well Patient left: in chair;with call bell in reach;with family/visitor present Nurse Communication: Mobility status for ambulation General Behavior During Session: Lethargic Cognition: WFL for tasks performed  Lara Mulch 01/28/2011, 1:19 PM [phone]

## 2011-01-28 NOTE — Progress Notes (Signed)
Subjective: Patient reports generalized head ache malaise  Objective: Vital signs in last 24 hours: Temp:  [97.6 F (36.4 C)-98.6 F (37 C)] 98 F (36.7 C) (11/30 1300) Pulse Rate:  [49-68] 61  (11/30 0956) Resp:  [16-20] 20  (11/30 1300) BP: (106-129)/(63-72) 129/69 mmHg (11/30 1300) SpO2:  [95 %-99 %] 99 % (11/30 1300)  Intake/Output from previous day: 11/29 0701 - 11/30 0700 In: 600 [I.V.:600] Out: -  Intake/Output this shift: Total I/O In: 1120 [P.O.:1120] Out: 600 [Urine:600]  1+ meningismus. Cr Nvs intact. Motor function ok No Drift.  Lab Results: No results found for this basename: WBC:2,HGB:2,HCT:2,PLT:2 in the last 72 hours BMET  [facility] 01/28/11 0855  NA 137  K 3.8  CL 101  CO2 24  GLUCOSE 144*  BUN 6  CREATININE 0.45*  CALCIUM 8.6    Studies/Results: No results found.  Assessment/Plan: Doing Fairly S/P SAH day14. Mild vasospasm radigraphically,.  LOS: 13 days  rehab and time. Electrolytes ok   Kaitlin Banks J 01/28/2011, 6:09 PM

## 2011-01-29 LAB — GLUCOSE, CAPILLARY
Glucose-Capillary: 103 mg/dL — ABNORMAL HIGH (ref 70–99)
Glucose-Capillary: 103 mg/dL — ABNORMAL HIGH (ref 70–99)

## 2011-01-29 NOTE — Progress Notes (Signed)
Subjective: Patient reports headache, low back, and mid back pain. Continue hydration. Possible repeat angio  Objective: Vital signs in last 24 hours: Temp:  [98 F (36.7 C)-98.7 F (37.1 C)] 98.1 F (36.7 C) (12/01 0956) Pulse Rate:  [52-68] 68  (12/01 0956) Resp:  [16-20] 20  (12/01 0956) BP: (107-129)/(67-73) 108/69 mmHg (12/01 0956) SpO2:  [96 %-99 %] 97 % (12/01 0956)  Intake/Output from previous day: 11/30 0701 - 12/01 0700 In: 2140 [P.O.:1540; I.V.:600] Out: 600 [Urine:600] Intake/Output this shift: Total I/O In: 120 [P.O.:120] Out: -   Neurologic: Mental status: Alert, oriented, thought content appropriate Cranial nerves: II: pupils equal, round, reactive to light and accommodation, III,IV,VI: extraocular muscles extra-ocular motions intact, V: facial light touch sensation normal bilaterally, VII: upper facial muscle function normal bilaterally, VII: lower facial muscle function normal bilaterally, VIII: hearing normal, IX: soft palate elevation normal bilaterally Motor: Normal Coordination: normal  Lab Results: No results found for this basename: WBC:2,HGB:2,HCT:2,PLT:2 in the last 72 hours BMET  [facility] 01/28/11 0855  NA 137  K 3.8  CL 101  CO2 24  GLUCOSE 144*  BUN 6  CREATININE 0.45*  CALCIUM 8.6    Studies/Results: No results found.  Assessment/Plan: Continue hydration. Exam remains normal.  LOS: 14 days     Corey Caulfield L 01/29/2011, 11:46 AM

## 2011-01-30 LAB — GLUCOSE, CAPILLARY: Glucose-Capillary: 110 mg/dL — ABNORMAL HIGH (ref 70–99)

## 2011-01-30 NOTE — Progress Notes (Signed)
Filed Vitals:   01/29/11 2150 01/30/11 0200 01/30/11 0600 01/30/11 0953  BP: 116/73 150/75 113/62 121/66  Pulse: 56 60 49 63  Temp: 98.2 F (36.8 C) 98.3 F (36.8 C) 98.1 F (36.7 C) 97.8 F (36.6 C)  TempSrc: Oral Oral Oral   Resp: 18 20 18 20   Height:      Weight:      SpO2: 98% 100% 97% 99%    BMET  Basename 01/28/11 0855  NA 137  K 3.8  CL 101  CO2 24  GLUCOSE 144*  BUN 6  CREATININE 0.45*  CALCIUM 8.6    Patient continues to complain of headache as well as hip pain. She describes as headache as severe at times and disabling.  On exam she is awake and alert fully oriented her speech is fluent she had good comprehension. Cranial nerves show pupils are equal round and reactive to light extraocular movements are intact facial movement is symmetrical. Motor examination shows 5 or 5 strength there is no drift of the upper extremities. 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   Plan: Patient continues to be supported with IV fluids we will continue her current management.

## 2011-01-31 LAB — BASIC METABOLIC PANEL
CO2: 29 mEq/L (ref 19–32)
GFR calc non Af Amer: >90 mL/min (ref >90)
Glucose, Bld: 109 mg/dL — ABNORMAL HIGH (ref 70–99)
Potassium: 3.6 mEq/L (ref 3.5–5.1)
Sodium: 137 mEq/L (ref 135–145)

## 2011-01-31 MED ORDER — MAGNESIUM HYDROXIDE 400 MG/5ML PO SUSP
30.0000 mL | Freq: Every day | ORAL | Status: DC | PRN
  Administered 2011-01-31: 30 mL via ORAL
  Filled 2011-01-31: qty 30

## 2011-01-31 NOTE — Progress Notes (Signed)
Pt refusing PT session due to headache.  Pt has been premedicated.  Will attempt back later today if time allows.   Kaitlin Banks, [place] [phone] 01/31/2011

## 2011-01-31 NOTE — Progress Notes (Signed)
Occupational Therapy Treatment Patient Details Name: Kaitlin Banks MRN: [id number] DOB: May 24, 1950 Today's Date: 01/31/2011  OT Assessment/Plan OT Assessment/Plan OT Plan: Discharge plan remains appropriate Equipment Recommended: Tub/shower seat OT Goals ADL Goals ADL Goal: Grooming - Progress: Progressing toward goals ADL Goal: Toilet Transfer - Progress: Progressing toward goals ADL Goal: Toileting - Clothing Manipulation - Progress: Progressing toward goals ADL Goal: Toileting - Hygiene - Progress: Progressing toward goals  OT Treatment ADL ADL Eating/Feeding: Performed;Independent Toilet Transfer: Performed Ecologist guard assist) Statistician Method: Proofreader: Raised toilet seat with arms (or 3-in-1 over toilet) Toileting - Clothing Manipulation: Performed (Min guard assist. required encouragement to perform I'ly) Toileting - Clothing Manipulation Details (indicate cue type and reason): Pt pulled gown up while standing before performing stand to sit on 3-in-1 Where Assessed - Glass blower/designer Manipulation: Standing Toileting - Hygiene: Performed;Supervision/safety Where Assessed - Toileting Hygiene: Sit to stand from 3-in-1 or toilet ADL Comments: Patient ambulated 50+ feet  Mobility  Transfers Sit to Stand: 5: Supervision;From chair/3-in-1 Stand to Sit: 5: Supervision;To chair/3-in-1 End of Session OT - End of Session Equipment Utilized During Treatment: Gait belt Activity Tolerance: Patient limited by pain (hip pain worsened with increased distance during ambulation) Patient left: in chair;with call bell in reach  Kaitlin Banks  01/31/2011, 2:09 PM

## 2011-01-31 NOTE — Progress Notes (Addendum)
Subjective: Patient reports no problem voiding, no BM since admission. Headache without change, neck stiff  Objective: Vital signs in last 24 hours: Temp:  [97.8 F (36.6 C)-98.2 F (36.8 C)] 97.8 F (36.6 C) (12/03 1000) Pulse Rate:  [50-64] 64  (12/03 1000) Resp:  [16-20] 16  (12/03 1000) BP: (93-123)/(47-75) 114/72 mmHg (12/03 1000) SpO2:  [96 %-98 %] 98 % (12/03 1000)  Intake/Output from previous day: 12/02 0701 - 12/03 0700 In: 1440 [P.O.:840; I.V.:600] Out: -  Intake/Output this shift:    1+ meningismus neuro stable otherwise  Lab Results: No results found for this basename: WBC:2,HGB:2,HCT:2,PLT:2 in the last 72 hours BMET No results found for this basename: NA:2,K:2,CL:2,CO2:2,GLUCOSE:2,BUN:2,CREATININE:2,CALCIUM:2 in the last 72 hours  Studies/Results: No results found.  Assessment/Plan: Recheck bmet prescribed laxatives  LOS: 16 days  Progress with activity discharge home soon   Keyandra Swenson J 01/31/2011, 2:52 PM

## 2011-02-01 LAB — GLUCOSE, CAPILLARY
Glucose-Capillary: 91 mg/dL (ref 70–99)
Glucose-Capillary: 102 mg/dL — ABNORMAL HIGH (ref 70–99)

## 2011-02-01 MED ORDER — LACTULOSE 10 GM/15ML PO SOLN
20.0000 g | Freq: Every day | ORAL | Status: DC | PRN
  Administered 2011-02-02: 20 g via ORAL
  Filled 2011-02-01 (×3): qty 30

## 2011-02-01 NOTE — Progress Notes (Signed)
Pt deferring PT session at this time due to pain despite encouragement.  Pt states she just returned to bed.   Pt has had pain medication.  Will attempt back later today if time permits.    Verdell Face, PTA [phone]

## 2011-02-01 NOTE — Progress Notes (Signed)
Subjective: Patient reports Headache unchanged no bowel movement yet  Objective: Vital signs in last 24 hours: Temp:  [97.5 F (36.4 C)-98.3 F (36.8 C)] 97.5 F (36.4 C) (12/04 1000) Pulse Rate:  [55-64] 64  (12/04 1000) Resp:  [16-18] 18  (12/04 1000) BP: (104-127)/(65-77) 106/69 mmHg (12/04 1000) SpO2:  [94 %-99 %] 99 % (12/04 1000)  Intake/Output from previous day: 12/03 0701 - 12/04 0700 In: 440 [P.O.:440] Out: -  Intake/Output this shift:    1+ meningismus. Her strength intact upper and lower extremities no evidence of cortical drift.  Lab Results: No results found for this basename: WBC:2,HGB:2,HCT:2,PLT:2 in the last 72 hours BMET  [facility] 01/31/11 1648  NA 137  K 3.6  CL 99  CO2 29  GLUCOSE 109*  BUN 7  CREATININE 0.60  CALCIUM 8.9    Studies/Results: No results found.  Assessment/Plan: Day 17 status post subarachnoid hemorrhage.  LOS: 17 days  Discontinue Nimotop. Lactulose by mouth   Kiala Faraj J 02/01/2011, 12:48 PM

## 2011-02-02 LAB — BASIC METABOLIC PANEL
CO2: 32 mEq/L (ref 19–32)
Glucose, Bld: 118 mg/dL — ABNORMAL HIGH (ref 70–99)
Potassium: 3.5 mEq/L (ref 3.5–5.1)
Sodium: 137 mEq/L (ref 135–145)

## 2011-02-02 LAB — GLUCOSE, CAPILLARY: Glucose-Capillary: 95 mg/dL (ref 70–99)

## 2011-02-02 NOTE — Progress Notes (Signed)
Subjective: Patient reports Feels better, less headache. Patient had bowel movement  Objective: Vital signs in last 24 hours: Temp:  [97.8 F (36.6 C)-98.6 F (37 C)] 98.6 F (37 C) (12/05 1405) Pulse Rate:  [59-69] 68  (12/05 1405) Resp:  [18-20] 18  (12/05 1405) BP: (106-123)/(58-77) 119/73 mmHg (12/05 1405) SpO2:  [97 %-99 %] 97 % (12/05 1405)  Intake/Output from previous day: 12/04 0701 - 12/05 0700 In: 685 [P.O.:600] Out: -  Intake/Output this shift: Total I/O In: 960 [P.O.:960] Out: -   Alert, 1+ meningismus. Cranial nerves pupils 3 mm equal brisk reactive to light no evidence of cortical drift ambulating without difficulty.  Lab Results: No results found for this basename: WBC:2,HGB:2,HCT:2,PLT:2 in the last 72 hours BMET  [facility] 01/31/11 1648  NA 137  K 3.6  CL 99  CO2 29  GLUCOSE 109*  BUN 7  CREATININE 0.60  CALCIUM 8.9    Studies/Results: No results found.  Assessment/Plan: Recheck be met in a.m., allow patient to shower.  LOS: 18 days  Plan discharge for Friday   Eisha Chatterjee J 02/02/2011, 6:15 PM

## 2011-02-02 NOTE — Progress Notes (Signed)
Physical Therapy Treatment Patient Details Name: Kaitlin Banks MRN: [id number] DOB: 28-May-1950 Today's Date: 02/02/2011  PT Assessment/Plan  PT - Assessment/Plan Comments on Treatment Session: Pt states she's feeling much better today.  Slept well last night & has had a good day overall.  No headache this afternoon.  Pt performed DGI & scored 17.   PT Plan: Discharge plan remains appropriate PT Frequency: Min 4X/week Follow Up Recommendations: Home health PT Equipment Recommended: None recommended by PT PT Goals  Acute Rehab PT Goals PT Goal Formulation: With patient/family Time For Goal Achievement: 7 days;Other (comment) (Goals updated 02/02/11) Pt will go Supine/Side to Sit: Independently PT Goal: Supine/Side to Sit - Progress: Revised (modified due to lack of progress/goal met) Pt will go Sit to Supine/Side: Independently PT Goal: Sit to Supine/Side - Progress: Revised (modified due to lack of progress/goal met) Pt will Ambulate: >150 feet;Independently;with least restrictive assistive device PT Goal: Ambulate - Progress: Revised (modified due to lack of progress/goal met) Additional Goals PT Goal: Additional Goal #1 - Progress: Not met **Goals updated today due to initial time frame for goal achievement expired**   PT Treatment Precautions/Restrictions  Precautions Precautions: Fall Required Braces or Orthoses: No Restrictions Weight Bearing Restrictions: No Mobility (including Balance) Bed Mobility Bed Mobility: No Transfers Sit to Stand: 6: Modified independent (Device/Increase time);From toilet;From chair/3-in-1 Stand to Sit: 6: Modified independent (Device/Increase time);To toilet;To chair/3-in-1 Ambulation/Gait Ambulation/Gait Assistance: 5: Supervision Ambulation/Gait Assistance Details (indicate cue type and reason): (S) for safety but no LOB noted with gait challenges.  DGI performed.  C/o of right hip discomfort with ambulation.  Gait becamce smoother with  increased distance.  Ambulation Distance (Feet): 300 Feet Assistive device: None Gait Pattern: Step-through pattern;Decreased stride length;Antalgic Gait velocity: slow gait velocity Stairs: Yes Stairs Assistance: 6: Modified independent (Device/Increase time) Stair Management Technique: Two rails;Alternating pattern;Forwards Number of Stairs:  (3+2; 2x's) Wheelchair Mobility Wheelchair Mobility: No  Dynamic Gait Index Level Surface: Mild Impairment (slow speed) Change in Gait Speed: Mild Impairment (unable to achieve a significant change in velocity) Gait with Horizontal Head Turns: Normal Gait with Vertical Head Turns: Normal Gait and Pivot Turn: Mild Impairment Step Over Obstacle: Moderate Impairment (must stop, then step over box. ) Step Around Obstacles: Mild Impairment Steps: Mild Impairment Total Score: 17  Exercise    End of Session PT - End of Session Equipment Utilized During Treatment: Gait belt Activity Tolerance: Patient tolerated treatment well Patient left: Other (comment) (left in bathroom per pts request.  Husband present.  ) General Behavior During Session: [facility] for tasks performed Cognition: [facility] for tasks performed  Lara Mulch 02/02/2011, 3:16 PM [phone]

## 2011-02-02 NOTE — Progress Notes (Signed)
Discussed with Verdell Face, PTA and agree with goal update.  Pt has had very slow progress due to pain and continued headaches.

## 2011-02-03 LAB — GLUCOSE, CAPILLARY

## 2011-02-03 NOTE — Progress Notes (Signed)
This patient was discussed at long LOS rounds 12.05.12  

## 2011-02-03 NOTE — Progress Notes (Signed)
Physical Therapy Treatment Patient Details Name: Kaitlin Banks MRN: [id number] DOB: 03-Dec-1950 Today's Date: 02/03/2011  PT Assessment/Plan  PT - Assessment/Plan Comments on Treatment Session: Pt moves well but seems to be self-limiting & not motivated to challenge herself.  Focus of PT session was to educate pt on HEP.   PT Plan: Discharge plan remains appropriate PT Frequency: Min 4X/week Follow Up Recommendations: Home health PT Equipment Recommended: None recommended by PT PT Goals  Acute Rehab PT Goals PT Goal: Ambulate - Progress: Not met Additional Goals PT Goal: Additional Goal #1 - Progress: Other (comment) (not addressed this session)  PT Treatment Precautions/Restrictions  Precautions Precautions: Fall Required Braces or Orthoses: No Restrictions Weight Bearing Restrictions: No Mobility (including Balance) Bed Mobility Bed Mobility: No Transfers Sit to Stand: 6: Modified independent (Device/Increase time);From chair/3-in-1;With upper extremity assist;With armrests Stand to Sit: 6: Modified independent (Device/Increase time);To chair/3-in-1;With upper extremity assist;With armrests Ambulation/Gait Ambulation/Gait Assistance: 7: Independent;5: Supervision Ambulation/Gait Assistance Details (indicate cue type and reason): (S) for safety due to pt c/o of mild lightheadiness with activity; distance not increased due to poor activity tolerance & pain.   Ambulation Distance (Feet): 120 Feet Assistive device: None Gait Pattern: Step-through pattern;Decreased step length - left;Decreased step length - right;Decreased stride length Gait velocity: slow gait velocity- Encouragement to increase gait speed if tolerable but pt states she is unable to  Stairs: No Wheelchair Mobility Wheelchair Mobility: No  Balance Balance Assessed: No Exercise  General Exercises - Lower Extremity Long Arc Quad: AROM;Both;15 reps;Seated Hip Flexion/Marching: AROM;Both;Other reps  (comment);Standing;Seated (15 reps) Toe Raises: AROM;15 reps;Seated;Both Heel Raises: AROM;Both;15 reps;Seated;Standing Mini-Sqauts: Strengthening;15 reps;Standing;Other (comment) (Bil UE support on sink countertop) Standing hip add/abd; 15 reps Standing Hip Extension/Flexion; 15 reps End of Session PT - End of Session Equipment Utilized During Treatment: Gait belt Activity Tolerance: Patient limited by fatigue;Patient limited by pain Patient left: in chair General Behavior During Session: [facility] for tasks performed Cognition: [facility] for tasks performed  Lara Mulch 02/03/2011, 2:58 PM [phone]

## 2011-02-04 MED ORDER — HYDROCODONE-ACETAMINOPHEN 5-325 MG PO TABS: 1.0000 | ORAL_TABLET | Freq: Four times a day (QID) | ORAL | Status: AC | PRN

## 2011-02-04 NOTE — Progress Notes (Signed)
Discharge planning. Spoke with patient and husband on 02/03/11. States her husband will assist at home has no HH needs, only needs rolling walker.Requested from [facility].

## 2011-02-04 NOTE — Discharge Summary (Signed)
Physician Discharge Summary  Patient ID: Kaitlin Banks MRN: [id number] DOB/AGE: Oct 06, 1950 60 y.o.  Admit date: 01/15/2011 Discharge date: 02/04/2011  Admission Diagnoses: Subarachnoid hemorrhage Cerebral vasospasm  Discharge Diagnoses: Subarachnoid hemorrhage Cerebral vasospasm. Constipation. Severe headache  Principal Problem:  *Subarachnoid hemorrhage Active Problems:  Cerebral vasospasm   Discharged Condition: fair  Hospital Course: Patient was admitted on 01/15/2011 with acute subarachnoid hemorrhage. CT angiography demonstrated no evidence of an aneurysm. She was treated with IV fluid hydration and monitored in the intensive care unit. After 5 days and angiogram of the cerebral vasculature was performed. This demonstrated moderate vasospasm in the posterior circulation. This was treated with further hydration therapy. The patient complained of significant constipation and this was finally relieved with laxatives and enema on the 17th hospital day. 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 The patient has been ambulatory since day 7 and she has gradually increased her activity and self-care during that time. Currently she still exhibits mild lethargy but has reached a point of stability allowing her to be discharged home  Consults: none  Significant Diagnostic Studies: angiography: Cerebral angiography  Treatments: Fluid hydration, visible therapy, occupational therapy.  Discharge Exam: Blood pressure 115/57, pulse 62, temperature 98.4 F (36.9 C), temperature source Oral, resp. rate 18, height 5\' 4"  (1.626 m), weight 67.4 kg (148 lb 9.4 oz), SpO2 99.00%. Alert, inial nerves normal 1+ meningismus. Station and gait normal  Disposition: Final discharge disposition not confirmed discharge home  Discharge Orders    Future Orders Please Complete By Expires   Diet - low sodium heart healthy      Increase activity slowly      Driving Restrictions      Comments:   May resume driving when not requiring any pain medication and headache resolved.   Lifting restrictions      Comments:   No lifting greater than 10 pounds. No repetitive bending lifting and stooping. Gradually increase activity as tolerated.   Sexual Activity Restrictions      Comments:   May resume sexual activity 1 headache resolved.   Call MD for:  temperature >100.4      Call MD for:  severe uncontrolled pain      Call MD for:  extreme fatigue        Current Discharge Medication List    START taking these medications   Details  HYDROcodone-acetaminophen (NORCO) 5-325 MG per tablet Take 1-2 tablets by mouth every 6 (six) hours as needed for pain. Qty: 60 tablet, Refills: 0       Follow-up Information    Follow up with Gema Ringold J in 4 weeks. (Call Aram Beecham for appointment)    Contact information:   [address] [place] [address] [phone]          Signed: Stefani Dama 02/04/2011, 11:14 AM

## 2011-02-04 NOTE — Progress Notes (Signed)
Subjective: Patient reports note for 02/02/11. Neuro status improving. Meningismus still present but further decreasing.  Objective: Vital signs in last 24 hours: Temp:  [98.1 F (36.7 C)-98.9 F (37.2 C)] 98.4 F (36.9 C) (12/07 0500) Pulse Rate:  [62-82] 62  (12/07 0500) Resp:  [17-20] 18  (12/07 0500) BP: (105-126)/(57-77) 115/57 mmHg (12/07 0500) SpO2:  [97 %-100 %] 99 % (12/07 0500)  Intake/Output from previous day:   Intake/Output this shift:    1+ menengismus still present. Neuro  otherwise normal including Cranial Nerves.  Lab Results: No results found for this basename: WBC:2,HGB:2,HCT:2,PLT:2 in the last 72 hours BMET  [facility] 02/02/11 1900  NA 137  K 3.5  CL 97  CO2 32  GLUCOSE 118*  BUN 9  CREATININE 0.61  CALCIUM 9.2    Studies/Results: No results found.  Assessment/Plan: Electrolyes normal off all IV hydration.   LOS: 20 days  Discharge home tomorrow   Kaitlin Banks 02/04/2011, 6:22 AM

## 2011-02-04 NOTE — Progress Notes (Signed)
Utilization review completed. Mycah Mcdougall, RN, BSN. 02/04/11  

## 2011-02-04 NOTE — Progress Notes (Signed)
Occupational Therapy Treatment Patient Details Name: Kaitlin Banks MRN: [id number] DOB: 1950-11-07 Today's Date: 02/04/2011  OT Assessment/Plan OT Assessment/Plan Comments on Treatment Session: patient tolerated treatment session much better today with less fatigue and no c/o of increase HA pain. Patient is eager to d/c home. OT Plan: Discharge plan needs to be updated Follow Up Recommendations: None Equipment Recommended: None recommended by OT OT Goals ADL Goals ADL Goal: Grooming - Progress: Met ADL Goal: Lower Body Dressing - Progress: Progressing toward goals ADL Goal: Toilet Transfer - Progress: Progressing toward goals ADL Goal: Toileting - Clothing Manipulation - Progress: Met ADL Goal: Toileting - Hygiene - Progress: Met  OT Treatment ADL ADL Eating/Feeding: Performed;Independent Where Assessed - Eating/Feeding: Chair Grooming: Performed;Wash/dry hands;Wash/dry face;Brushing hair;Independent Grooming Details (indicate cue type and reason): easily fatigued Where Assessed - Grooming: Standing at sink Upper Body Dressing: Performed;Set up Where Assessed - Upper Body Dressing: Sitting, chair Lower Body Dressing: Performed;Set up;Supervision/safety Where Assessed - Lower Body Dressing: Sit to stand from chair Toilet Transfer: Performed;Modified independent Toilet Transfer Method: Proofreader: Regular height toilet;Grab bars Toileting - Clothing Manipulation: Performed;Independent Toileting - Clothing Manipulation Details (indicate cue type and reason): Pt pulled gown up while standing before performing stand to sit on 3-in-1 Where Assessed - Toileting Clothing Manipulation: Standing Toileting - Hygiene: Performed;Independent Where Assessed - Toileting Hygiene: Sit on 3-in-1 or toilet Mobility  Bed Mobility Supine to Sit: 7: Independent;HOB flat Sitting - Scoot to Edge of Bed: 7: Independent Transfers Sit to Stand: 6: Modified independent  (Device/Increase time);From bed;Without upper extremity assist Stand to Sit: 6: Modified independent (Device/Increase time);To chair/3-in-1;With armrests  End of Session OT - End of Session Equipment Utilized During Treatment: Gait belt Patient left: in chair;with call bell in reach;with family/visitor present Nurse Communication: Mobility status for transfers;Mobility status for ambulation General Behavior During Session: [facility] for tasks performed Cognition: [facility] for tasks performed  Lonald Troiani  02/04/2011, 3:06 PM

## 2011-02-24 NOTE — H&P (Signed)
Kaitlin Banks is an 60 y.o. female.   Chief Complaint: Cerebral angiogram ordered for further evaluation of recent Subarachnoid Hemorrhage HPI: 60 yo female with recent Subarachnoid Hemorrhage. CT angiogram performed at time of admission did not show bleeding source. Now scheduled for cerebral angiogram.  Past Medical History  Diagnosis Date  . Migraine headache   . Asthma   . Diabetes mellitus     Past Surgical History  Procedure Date  . Tonsillectomy     History reviewed. No pertinent family history. Social History:  reports that she has never smoked. She does not have any smokeless tobacco history on file. She reports that she does not drink alcohol or use illicit drugs.  Allergies: No Known Allergies  Medications Prior to Admission  Medication Dose Route Frequency Provider Last Rate Last Dose  . dexamethasone (DECADRON) injection 4 mg  4 mg Intravenous Q6H Gary P Cram   4 mg at 01/21/11 1756  . dexamethasone (DECADRON) injection 4 mg  4 mg Intravenous Q6H Duane Lope Jenkins   4 mg at 01/22/11 2352  . iohexol (OMNIPAQUE) 300 MG/ML injection 150 mL  150 mL Intravenous Once PRN Stefani Dama      . iohexol (OMNIPAQUE) 350 MG/ML injection 60 mL  60 mL Intravenous Once PRN Medication Radiologist   60 mL at 01/15/11 0821  . morphine 4 MG/ML injection        2 mg at 01/15/11 1331  . DISCONTD: 0.9 %  sodium chloride infusion   Intravenous Continuous Shary Key Elsner 100 mL/hr at 01/17/11 2014    . DISCONTD: 0.9 %  sodium chloride infusion   Intravenous Continuous Shary Key Elsner 50 mL/hr at 01/19/11 1438    . DISCONTD: 0.9 %  sodium chloride infusion   Intravenous Continuous Masie Bermingham, PA      . DISCONTD: 0.9 %  sodium chloride infusion   Intravenous PRN Sanjeev K Deveshwar   20 mL at 01/25/11 1245  . DISCONTD: 0.9 % NaCl with KCl 20 mEq/ L  infusion   Intravenous Continuous Shary Key Elsner 50 mL/hr at 01/31/11 1317    . DISCONTD: acetaminophen (TYLENOL) solution 650 mg  650 mg Oral  Q4H PRN Shary Key Elsner   650 mg at 01/23/11 0311  . DISCONTD: acetaminophen (TYLENOL) suppository 650 mg  650 mg Rectal Q4H PRN Stefani Dama      . DISCONTD: acetaminophen (TYLENOL) suppository 650 mg  650 mg Rectal Q4H PRN Stefani Dama      . DISCONTD: acetaminophen (TYLENOL) tablet 650 mg  650 mg Oral Q4H PRN Stefani Dama      . DISCONTD: acetaminophen (TYLENOL) tablet 650 mg  650 mg Oral Q4H PRN Stefani Dama      . DISCONTD: fentaNYL (SUBLIMAZE) injection 50 mcg  50 mcg Intravenous Once Hurman Horn, MD      . DISCONTD: fentaNYL (SUBLIMAZE) injection   Intravenous PRN Sanjeev K Deveshwar   12.5 mcg at 01/25/11 1248  . DISCONTD: HYDROcodone-acetaminophen (LORTAB) 7.5-500 MG/15ML solution 20 mL  10 mg Oral Q4H PRN Cristi Loron   20 mL at 02/04/11 1451  . DISCONTD: labetalol (NORMODYNE,TRANDATE) injection 10-40 mg  10-40 mg Intravenous Q10 min PRN Stefani Dama      . DISCONTD: lactulose (CHRONULAC) 10 GM/15ML solution 20 g  20 g Oral Daily PRN Shary Key Elsner   20 g at 02/02/11 1026  . DISCONTD: magnesium hydroxide (MILK OF MAGNESIA) suspension 30 mL  30 mL Oral Daily PRN Shary Key Elsner   30 mL at 01/31/11 1627  . DISCONTD: midazolam (VERSED) 5 MG/5ML injection   Intravenous PRN Sanjeev K Deveshwar   0.5 mg at 01/25/11 1247  . DISCONTD: morphine 2 MG/ML injection 1-4 mg  1-4 mg Intravenous Q1H PRN Shary Key Elsner   2 mg at 01/28/11 1937  . DISCONTD: niMODipine (NIMOTOP) 30 mg/mL oral solution 30 mg  30 mg Oral Q4H Ernesto M Botero   30 mg at 02/01/11 1004  . DISCONTD: niMODipine (NIMOTOP) 30 mg/mL oral solution 60 mg  60 mg Per Tube Q4H Henry J Elsner   60 mg at 01/19/11 1005  . DISCONTD: niMODipine (NIMOTOP) capsule 30 mg  30 mg Oral Q4H Henry J Elsner   30 mg at 01/19/11 1835  . DISCONTD: niMODipine (NIMOTOP) capsule 60 mg  60 mg Oral Q4H Henry J Elsner   60 mg at 01/16/11 2215  . DISCONTD: ondansetron (ZOFRAN) injection 4 mg  4 mg Intravenous Once Hurman Horn, MD      . DISCONTD:  ondansetron [facility]) injection 4 mg  4 mg Intravenous Q6H PRN Shary Key Elsner   4 mg at 01/24/11 1039  . DISCONTD: pantoprazole (PROTONIX) injection 40 mg  40 mg Intravenous QHS Shary Key Elsner   40 mg at 01/17/11 2203  . DISCONTD: pantoprazole sodium (PROTONIX) 40 mg/20 mL oral suspension 40 mg  40 mg Per Tube Q1200 Kendra P Hiatt, PHARMD   40 mg at 02/03/11 1440  . DISCONTD: potassium chloride (KLOR-CON) packet 20 mEq  20 mEq Oral BID Stefani Dama      . DISCONTD: potassium chloride (KLOR-CON) packet 20 mEq  20 mEq Oral BID Stefani Dama      . DISCONTD: potassium chloride 20 MEQ/15ML (10%) liquid 20 mEq  20 mEq Oral BID Tanya Nones Botero   20 mEq at 02/04/11 1047  . DISCONTD: potassium chloride SA (K-DUR,KLOR-CON) CR tablet 20 mEq  20 mEq Oral BID Stefani Dama      . DISCONTD: promethazine (PHENERGAN) injection 12.5 mg  12.5 mg Intravenous Q6H PRN Donzetta Sprung Cram   12.5 mg at 01/23/11 1530  . DISCONTD: senna-docusate (Senokot-S) tablet 1 tablet  1 tablet Oral BID Shary Key Elsner   1 tablet at 02/04/11 1000   Medications Prior to Admission  Medication Sig Dispense Refill  . HYDROcodone-acetaminophen (NORCO) 5-325 MG per tablet Take 1-2 tablets by mouth every 6 (six) hours as needed for pain.  60 tablet  0    No results found for this or any previous visit (from the past 48 hour(s)). No results found.  ROS  See admission H&P. Pt. Has on going headaches.  Blood pressure 115/57, pulse 62, temperature 98.4 F (36.9 C), temperature source Oral, resp. rate 18, height 5\' 4"  (1.626 m), weight 148 lb 9.4 oz (67.4 kg), SpO2 99.00%. Physical Exam  60 yo female sitting on edge of bed uncomfortable secondary to headache. Heart - RRR Lungs - clear Exts - LUE swollen from previous IV. Lower extremities - bilateral compression device. Pulses intact. Moves all extremities - motor 4/5 throughout. Sensation intact to light touch.  Assessment/Plan Cerebral angiogram explained to patient and husband along  with risks and benefits. Informed consent obtained from patient's husband. Angiogram scheduled for tomorrow to be performed by Dr. Corliss Skains.  Jaryah Aracena 02/24/2011, 2:50 PM

## 2011-04-21 ENCOUNTER — Other Ambulatory Visit: Payer: Self-pay | Admitting: Neurological Surgery

## 2011-04-21 DIAGNOSIS — S065XAA Traumatic subdural hemorrhage with loss of consciousness status unknown, initial encounter: Secondary | ICD-10-CM

## 2011-04-21 DIAGNOSIS — S065X9A Traumatic subdural hemorrhage with loss of consciousness of unspecified duration, initial encounter: Secondary | ICD-10-CM

## 2011-05-11 ENCOUNTER — Ambulatory Visit
Admission: RE | Admit: 2011-05-11 | Discharge: 2011-05-11 | Disposition: A | Discharge status: Home / Self Care | Payer: BC Managed Care – PPO | Source: Ambulatory Visit | Attending: Neurological Surgery | Admitting: Neurological Surgery

## 2011-05-11 DIAGNOSIS — S065X9A Traumatic subdural hemorrhage with loss of consciousness of unspecified duration, initial encounter: Secondary | ICD-10-CM

## 2011-08-16 ENCOUNTER — Other Ambulatory Visit: Payer: Self-pay | Admitting: Neurological Surgery

## 2011-08-16 DIAGNOSIS — M5412 Radiculopathy, cervical region: Secondary | ICD-10-CM

## 2011-08-23 ENCOUNTER — Ambulatory Visit
Admission: RE | Admit: 2011-08-23 | Discharge: 2011-08-23 | Disposition: A | Discharge status: Home / Self Care | Payer: BC Managed Care – PPO | Source: Ambulatory Visit | Attending: Neurological Surgery | Admitting: Neurological Surgery

## 2011-08-23 DIAGNOSIS — M5412 Radiculopathy, cervical region: Secondary | ICD-10-CM

## 2012-03-05 IMAGING — CT CT HEAD W/O CM
1 series · 16 of 30 positions shown, 20 images · non-contrast
Comparison: 01/15/2011.

CLINICAL DATA: Followup subarachnoid hemorrhage.  Rule out
hydrocephalus.

CT HEAD WITHOUT CONTRAST
TECHNIQUE: Contiguous axial images were obtained from the base of
the skull through the vertex without contrast.

[Series 2: head routine 4.8 h37s · axial · 0.43mm/px · z∈[-91,+64]mm · 16 of 36 slices shown, 20 images]
[im 2/36  brain]
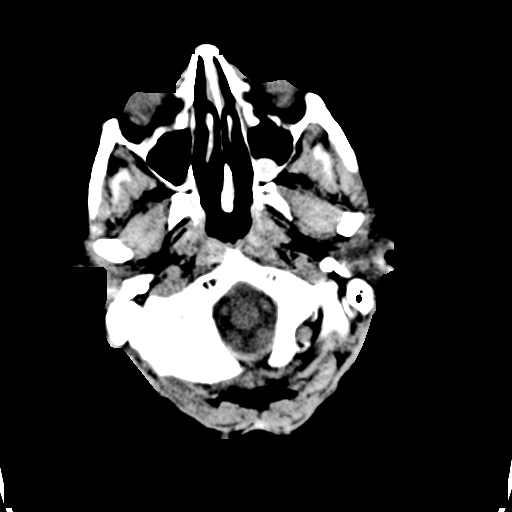
[im 2/36  bone]
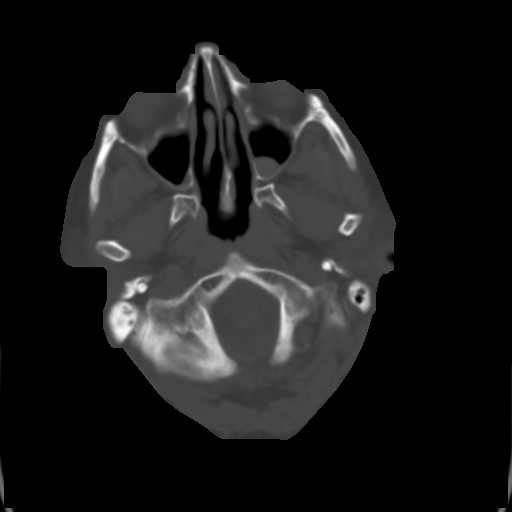
[im 4/36  brain]
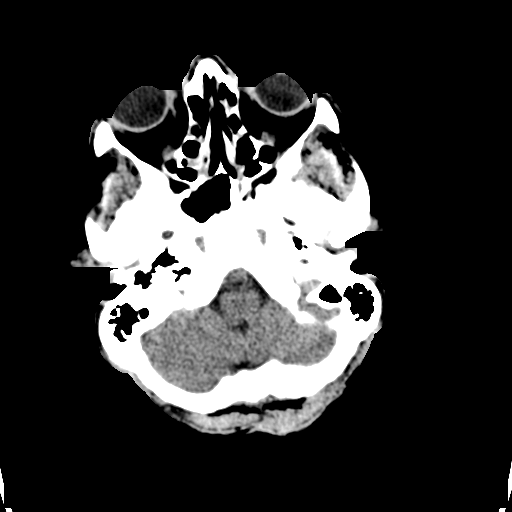
[im 7/36  brain]
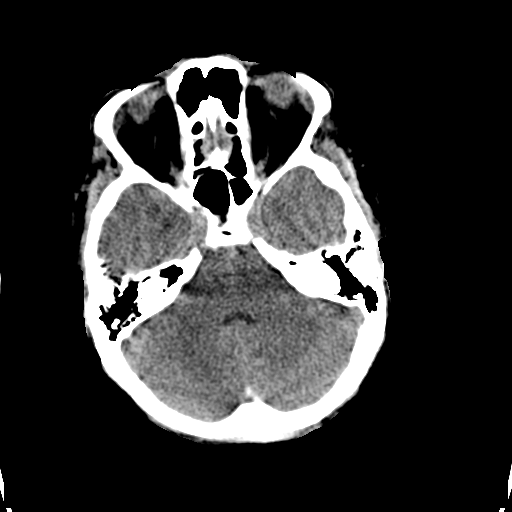
[im 9/36  brain]
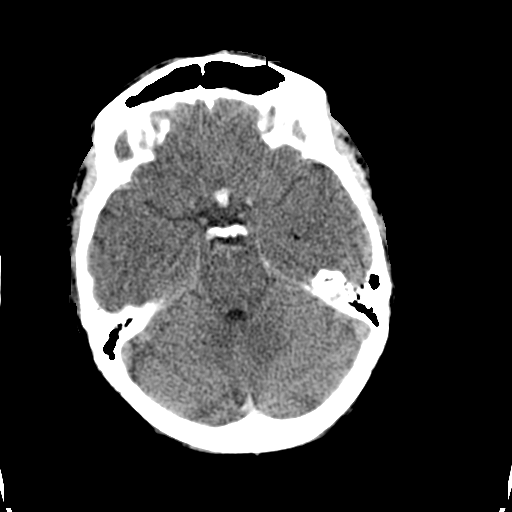
[im 10/36  brain]
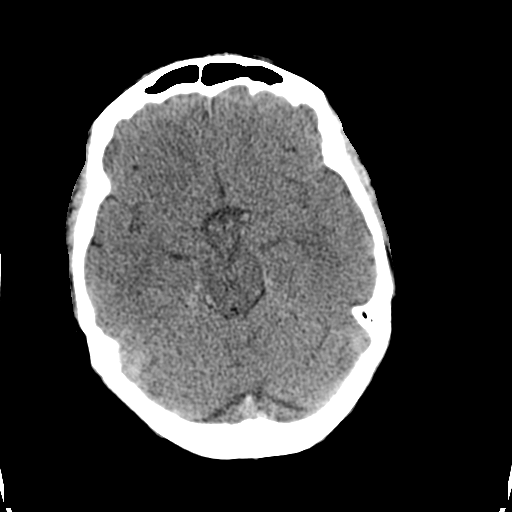
[im 10/36  bone]
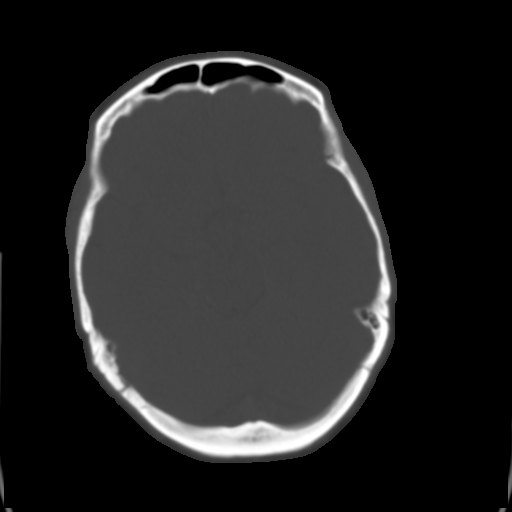
[im 13/36  brain]
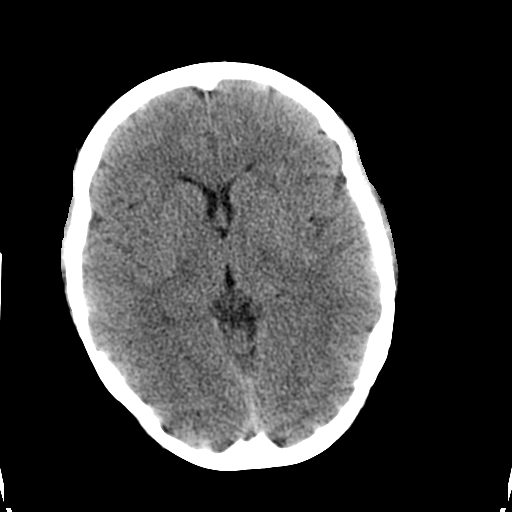
[im 15/36  brain]
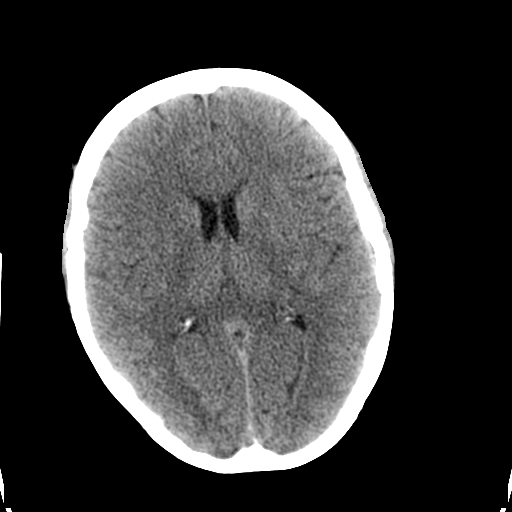
[im 17/36  brain]
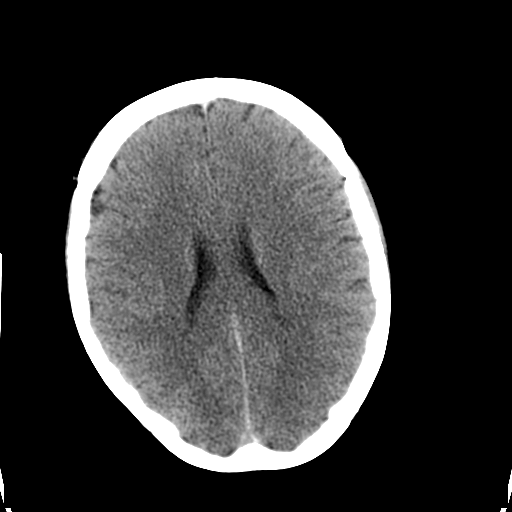
[im 19/36  brain]
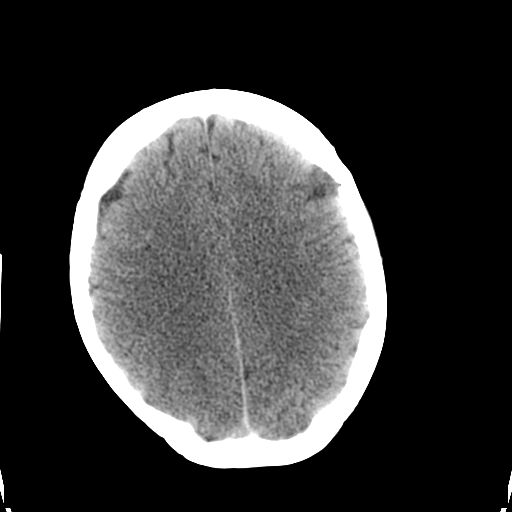
[im 19/36  bone]
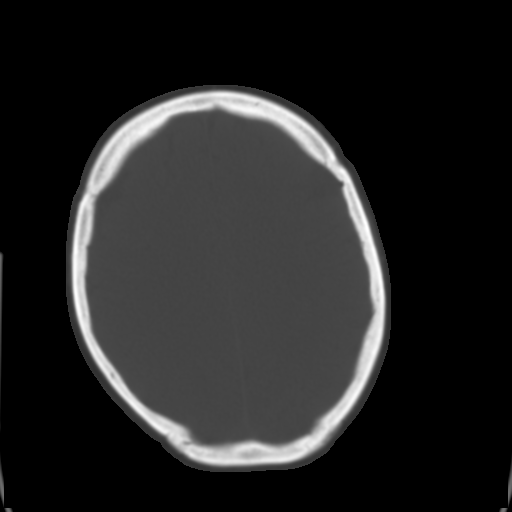
[im 21/36  brain]
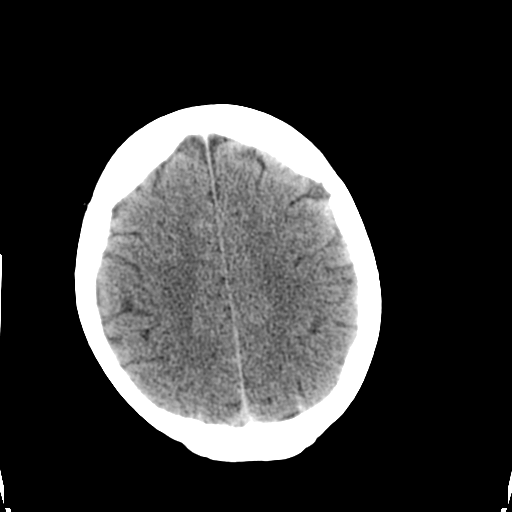
[im 23/36  brain]
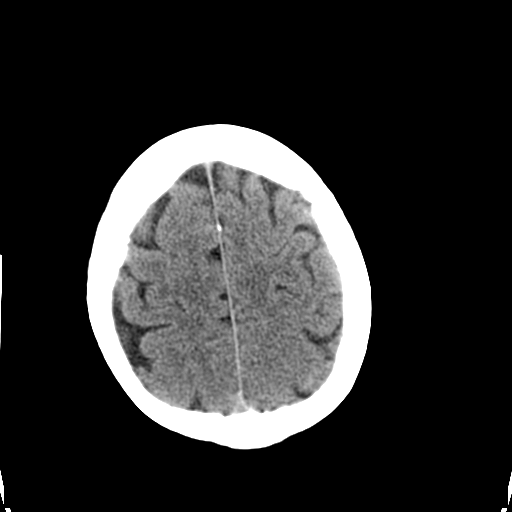
[im 26/36  brain]
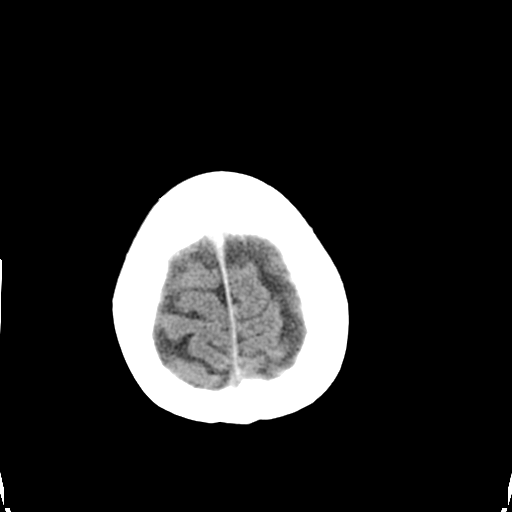
[im 27/36  brain]
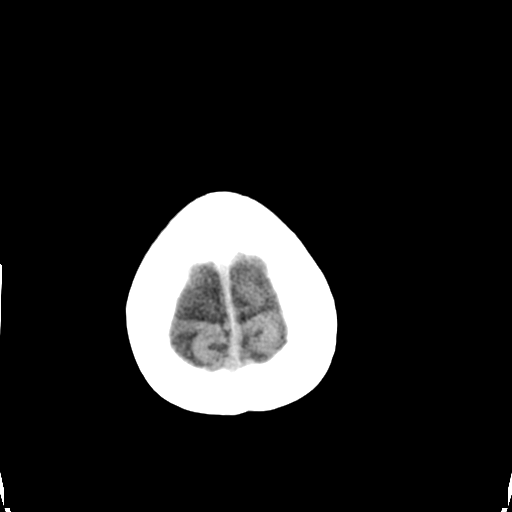
[im 27/36  bone]
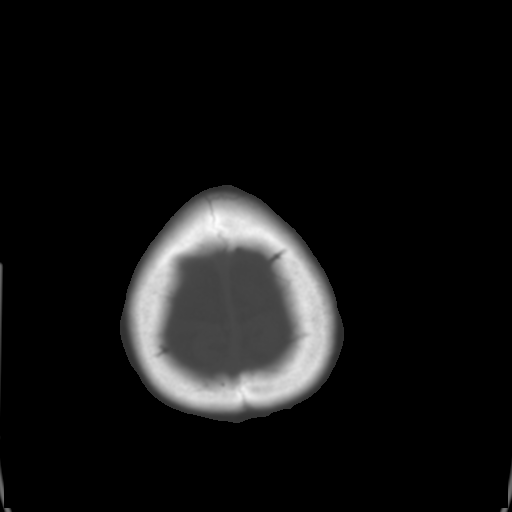
[im 29/36  brain]
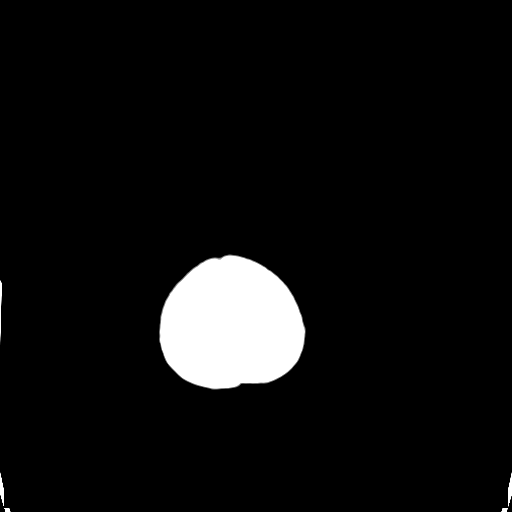
[im 32/36  brain]
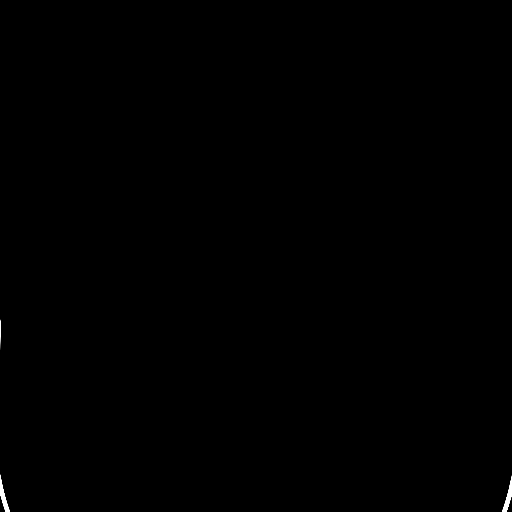
[im 34/36  brain]
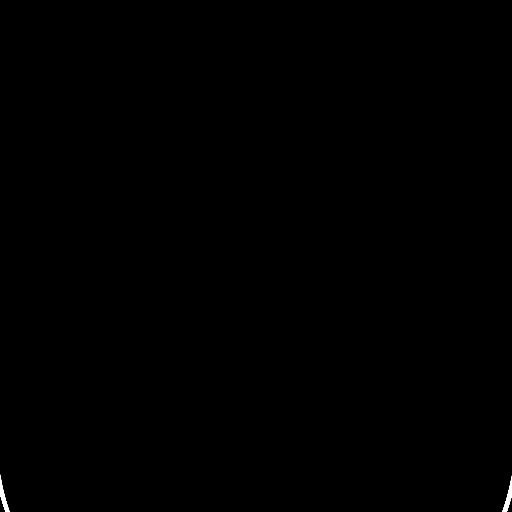

[16 of 30 positions shown; findings below may reference images not displayed]

FINDINGS: There is been decrease in the amount of subarachnoid
blood which has shifted position slightly.  Subarachnoid blood
remains.  No interval development of hydrocephalus or CT evidence
of large acute infarct.

No intracranial mass lesion detected on this unenhanced exam.

Polypoid opacification left maxillary sinus.  Minimal mucosal
thickening right maxillary sinus and mild mucosal thickening
ethmoid sinus air cells.
IMPRESSION: Decrease in amount of subarachnoid blood which has shifted position
slightly (more notable in the left parietal - occipital lobe
region).

No evidence of hydrocephalus or CT findings of large acute infarct.

## 2016-01-07 DIAGNOSIS — H5213 Myopia, bilateral: Secondary | ICD-10-CM | POA: Diagnosis not present

## 2016-03-28 DIAGNOSIS — R05 Cough: Secondary | ICD-10-CM | POA: Diagnosis not present

## 2016-03-28 DIAGNOSIS — J0100 Acute maxillary sinusitis, unspecified: Secondary | ICD-10-CM | POA: Diagnosis not present

## 2016-05-20 DIAGNOSIS — R300 Dysuria: Secondary | ICD-10-CM | POA: Diagnosis not present

## 2016-05-20 DIAGNOSIS — R062 Wheezing: Secondary | ICD-10-CM | POA: Diagnosis not present

## 2016-05-20 DIAGNOSIS — R5383 Other fatigue: Secondary | ICD-10-CM | POA: Diagnosis not present

## 2016-05-20 DIAGNOSIS — R1310 Dysphagia, unspecified: Secondary | ICD-10-CM | POA: Diagnosis not present

## 2016-06-21 DIAGNOSIS — K219 Gastro-esophageal reflux disease without esophagitis: Secondary | ICD-10-CM | POA: Diagnosis not present

## 2016-06-21 DIAGNOSIS — R1310 Dysphagia, unspecified: Secondary | ICD-10-CM | POA: Diagnosis not present

## 2016-06-21 DIAGNOSIS — W57XXXA Bitten or stung by nonvenomous insect and other nonvenomous arthropods, initial encounter: Secondary | ICD-10-CM | POA: Diagnosis not present

## 2016-06-21 DIAGNOSIS — F5101 Primary insomnia: Secondary | ICD-10-CM | POA: Diagnosis not present

## 2016-07-19 DIAGNOSIS — R1310 Dysphagia, unspecified: Secondary | ICD-10-CM | POA: Diagnosis not present

## 2016-07-19 DIAGNOSIS — K219 Gastro-esophageal reflux disease without esophagitis: Secondary | ICD-10-CM | POA: Diagnosis not present

## 2016-07-22 DIAGNOSIS — K224 Dyskinesia of esophagus: Secondary | ICD-10-CM | POA: Diagnosis not present

## 2016-08-04 DIAGNOSIS — L659 Nonscarring hair loss, unspecified: Secondary | ICD-10-CM | POA: Diagnosis not present

## 2016-08-04 DIAGNOSIS — K219 Gastro-esophageal reflux disease without esophagitis: Secondary | ICD-10-CM | POA: Diagnosis not present

## 2016-08-04 DIAGNOSIS — R300 Dysuria: Secondary | ICD-10-CM | POA: Diagnosis not present

## 2016-08-04 DIAGNOSIS — R252 Cramp and spasm: Secondary | ICD-10-CM | POA: Diagnosis not present

## 2016-08-10 DIAGNOSIS — K222 Esophageal obstruction: Secondary | ICD-10-CM | POA: Diagnosis not present

## 2016-08-10 DIAGNOSIS — Q393 Congenital stenosis and stricture of esophagus: Secondary | ICD-10-CM | POA: Diagnosis not present

## 2016-08-10 DIAGNOSIS — E039 Hypothyroidism, unspecified: Secondary | ICD-10-CM | POA: Diagnosis not present

## 2016-08-10 DIAGNOSIS — R1310 Dysphagia, unspecified: Secondary | ICD-10-CM | POA: Diagnosis not present

## 2016-08-10 DIAGNOSIS — K219 Gastro-esophageal reflux disease without esophagitis: Secondary | ICD-10-CM | POA: Diagnosis not present

## 2016-08-10 DIAGNOSIS — K449 Diaphragmatic hernia without obstruction or gangrene: Secondary | ICD-10-CM | POA: Diagnosis not present

## 2016-08-10 DIAGNOSIS — E559 Vitamin D deficiency, unspecified: Secondary | ICD-10-CM | POA: Diagnosis not present

## 2016-08-10 DIAGNOSIS — K2970 Gastritis, unspecified, without bleeding: Secondary | ICD-10-CM | POA: Diagnosis not present

## 2016-08-10 DIAGNOSIS — E279 Disorder of adrenal gland, unspecified: Secondary | ICD-10-CM | POA: Diagnosis not present

## 2016-09-20 DIAGNOSIS — R1310 Dysphagia, unspecified: Secondary | ICD-10-CM | POA: Diagnosis not present

## 2016-09-20 DIAGNOSIS — K219 Gastro-esophageal reflux disease without esophagitis: Secondary | ICD-10-CM | POA: Diagnosis not present

## 2016-10-13 DIAGNOSIS — L292 Pruritus vulvae: Secondary | ICD-10-CM | POA: Diagnosis not present

## 2016-10-13 DIAGNOSIS — Z124 Encounter for screening for malignant neoplasm of cervix: Secondary | ICD-10-CM | POA: Diagnosis not present

## 2016-10-13 DIAGNOSIS — Z01419 Encounter for gynecological examination (general) (routine) without abnormal findings: Secondary | ICD-10-CM | POA: Diagnosis not present

## 2016-10-13 DIAGNOSIS — N898 Other specified noninflammatory disorders of vagina: Secondary | ICD-10-CM | POA: Diagnosis not present

## 2016-10-25 ENCOUNTER — Other Ambulatory Visit: Payer: Self-pay | Admitting: Obstetrics and Gynecology

## 2016-10-25 DIAGNOSIS — M8580 Other specified disorders of bone density and structure, unspecified site: Secondary | ICD-10-CM

## 2016-11-22 DIAGNOSIS — N898 Other specified noninflammatory disorders of vagina: Secondary | ICD-10-CM | POA: Diagnosis not present

## 2016-12-20 DIAGNOSIS — Z1211 Encounter for screening for malignant neoplasm of colon: Secondary | ICD-10-CM | POA: Diagnosis not present

## 2016-12-20 DIAGNOSIS — Z0000 Encounter for general adult medical examination without abnormal findings: Secondary | ICD-10-CM | POA: Diagnosis not present

## 2017-04-26 DIAGNOSIS — H524 Presbyopia: Secondary | ICD-10-CM | POA: Diagnosis not present

## 2017-04-26 DIAGNOSIS — H2513 Age-related nuclear cataract, bilateral: Secondary | ICD-10-CM | POA: Diagnosis not present

## 2017-04-26 DIAGNOSIS — H5213 Myopia, bilateral: Secondary | ICD-10-CM | POA: Diagnosis not present

## 2017-09-13 DIAGNOSIS — M79604 Pain in right leg: Secondary | ICD-10-CM | POA: Diagnosis not present

## 2017-09-13 DIAGNOSIS — M79605 Pain in left leg: Secondary | ICD-10-CM | POA: Diagnosis not present

## 2017-09-13 DIAGNOSIS — M7989 Other specified soft tissue disorders: Secondary | ICD-10-CM | POA: Diagnosis not present

## 2017-09-13 DIAGNOSIS — R609 Edema, unspecified: Secondary | ICD-10-CM | POA: Diagnosis not present

## 2017-09-27 DIAGNOSIS — Z79899 Other long term (current) drug therapy: Secondary | ICD-10-CM | POA: Diagnosis not present

## 2017-09-27 DIAGNOSIS — M25572 Pain in left ankle and joints of left foot: Secondary | ICD-10-CM | POA: Diagnosis not present

## 2017-09-27 DIAGNOSIS — R769 Abnormal immunological finding in serum, unspecified: Secondary | ICD-10-CM | POA: Diagnosis not present

## 2017-09-27 DIAGNOSIS — M25571 Pain in right ankle and joints of right foot: Secondary | ICD-10-CM | POA: Diagnosis not present

## 2017-09-27 DIAGNOSIS — M7989 Other specified soft tissue disorders: Secondary | ICD-10-CM | POA: Diagnosis not present

## 2017-10-05 DIAGNOSIS — M7989 Other specified soft tissue disorders: Secondary | ICD-10-CM | POA: Diagnosis not present

## 2017-10-05 DIAGNOSIS — M25571 Pain in right ankle and joints of right foot: Secondary | ICD-10-CM | POA: Diagnosis not present

## 2017-10-05 DIAGNOSIS — M25572 Pain in left ankle and joints of left foot: Secondary | ICD-10-CM | POA: Diagnosis not present

## 2017-10-10 DIAGNOSIS — N949 Unspecified condition associated with female genital organs and menstrual cycle: Secondary | ICD-10-CM | POA: Diagnosis not present

## 2017-10-10 DIAGNOSIS — N763 Subacute and chronic vulvitis: Secondary | ICD-10-CM | POA: Diagnosis not present

## 2017-10-18 DIAGNOSIS — R600 Localized edema: Secondary | ICD-10-CM | POA: Diagnosis not present

## 2017-10-18 DIAGNOSIS — M79604 Pain in right leg: Secondary | ICD-10-CM | POA: Diagnosis not present

## 2017-11-01 DIAGNOSIS — I83813 Varicose veins of bilateral lower extremities with pain: Secondary | ICD-10-CM | POA: Diagnosis not present

## 2017-11-14 DIAGNOSIS — I8311 Varicose veins of right lower extremity with inflammation: Secondary | ICD-10-CM | POA: Diagnosis not present

## 2017-11-14 DIAGNOSIS — R600 Localized edema: Secondary | ICD-10-CM | POA: Diagnosis not present

## 2017-11-22 DIAGNOSIS — N763 Subacute and chronic vulvitis: Secondary | ICD-10-CM | POA: Diagnosis not present

## 2017-11-22 DIAGNOSIS — N949 Unspecified condition associated with female genital organs and menstrual cycle: Secondary | ICD-10-CM | POA: Diagnosis not present

## 2017-11-22 DIAGNOSIS — Z01419 Encounter for gynecological examination (general) (routine) without abnormal findings: Secondary | ICD-10-CM | POA: Insufficient documentation

## 2017-11-30 DIAGNOSIS — N763 Subacute and chronic vulvitis: Secondary | ICD-10-CM | POA: Insufficient documentation

## 2017-12-01 DIAGNOSIS — M7661 Achilles tendinitis, right leg: Secondary | ICD-10-CM | POA: Diagnosis not present

## 2017-12-01 DIAGNOSIS — M7662 Achilles tendinitis, left leg: Secondary | ICD-10-CM | POA: Diagnosis not present

## 2017-12-01 DIAGNOSIS — M25572 Pain in left ankle and joints of left foot: Secondary | ICD-10-CM | POA: Diagnosis not present

## 2017-12-01 DIAGNOSIS — M25571 Pain in right ankle and joints of right foot: Secondary | ICD-10-CM | POA: Diagnosis not present

## 2017-12-26 DIAGNOSIS — M76821 Posterior tibial tendinitis, right leg: Secondary | ICD-10-CM | POA: Diagnosis not present

## 2017-12-26 DIAGNOSIS — M7662 Achilles tendinitis, left leg: Secondary | ICD-10-CM | POA: Diagnosis not present

## 2017-12-26 DIAGNOSIS — M25571 Pain in right ankle and joints of right foot: Secondary | ICD-10-CM | POA: Diagnosis not present

## 2017-12-26 DIAGNOSIS — M7661 Achilles tendinitis, right leg: Secondary | ICD-10-CM | POA: Diagnosis not present

## 2018-01-29 DIAGNOSIS — M48061 Spinal stenosis, lumbar region without neurogenic claudication: Secondary | ICD-10-CM | POA: Diagnosis not present

## 2018-01-29 DIAGNOSIS — M47817 Spondylosis without myelopathy or radiculopathy, lumbosacral region: Secondary | ICD-10-CM | POA: Diagnosis not present

## 2018-01-29 DIAGNOSIS — M545 Low back pain: Secondary | ICD-10-CM | POA: Diagnosis not present

## 2018-01-29 DIAGNOSIS — M4807 Spinal stenosis, lumbosacral region: Secondary | ICD-10-CM | POA: Diagnosis not present

## 2019-01-29 ENCOUNTER — Other Ambulatory Visit: Payer: Self-pay | Admitting: Internal Medicine

## 2019-01-29 DIAGNOSIS — Z1231 Encounter for screening mammogram for malignant neoplasm of breast: Secondary | ICD-10-CM

## 2019-02-05 DIAGNOSIS — L723 Sebaceous cyst: Secondary | ICD-10-CM | POA: Diagnosis not present

## 2019-03-07 DIAGNOSIS — L723 Sebaceous cyst: Secondary | ICD-10-CM | POA: Diagnosis not present

## 2019-03-07 DIAGNOSIS — Z6829 Body mass index (BMI) 29.0-29.9, adult: Secondary | ICD-10-CM | POA: Diagnosis not present

## 2019-03-07 DIAGNOSIS — Z01419 Encounter for gynecological examination (general) (routine) without abnormal findings: Secondary | ICD-10-CM | POA: Diagnosis not present

## 2020-09-28 DIAGNOSIS — R1011 Right upper quadrant pain: Secondary | ICD-10-CM | POA: Diagnosis not present

## 2020-09-28 DIAGNOSIS — K8000 Calculus of gallbladder with acute cholecystitis without obstruction: Secondary | ICD-10-CM | POA: Diagnosis not present

## 2020-09-28 DIAGNOSIS — K810 Acute cholecystitis: Secondary | ICD-10-CM | POA: Diagnosis not present

## 2020-09-28 DIAGNOSIS — K8020 Calculus of gallbladder without cholecystitis without obstruction: Secondary | ICD-10-CM | POA: Diagnosis not present

## 2020-09-28 DIAGNOSIS — R109 Unspecified abdominal pain: Secondary | ICD-10-CM | POA: Diagnosis not present

## 2020-10-06 DIAGNOSIS — Z09 Encounter for follow-up examination after completed treatment for conditions other than malignant neoplasm: Secondary | ICD-10-CM | POA: Insufficient documentation

## 2020-10-06 DIAGNOSIS — K8000 Calculus of gallbladder with acute cholecystitis without obstruction: Secondary | ICD-10-CM | POA: Insufficient documentation

## 2020-10-08 ENCOUNTER — Encounter: Payer: Self-pay | Admitting: *Deleted

## 2020-10-08 ENCOUNTER — Other Ambulatory Visit: Payer: Self-pay | Admitting: *Deleted

## 2020-10-08 NOTE — Patient Outreach (Signed)
[facility]) Care Management  10/08/2020  Kaitlin Banks [birth year] [id number]   [facility] outreach to post hospital patient  Kaitlin Banks was referred to [facility] on 10/05/20 for post hospital services after her discharge from [facility] on 10/01/20  Her husband, Max answered the phone at [phone] He reports pt resting  He was able to verify HIPAA identifiers   Initial assessment  Mr Beckers reports the patient is doing well after her laparoscopic cholecystectomy Very short answers received for transition of care assessment Pt reported to have her discharge instructions, understand them, have scheduled follow up appointments, equipment and medications. Other social determinants of health (SDOH) and needs voiced without needs   Plans spouse agrees to care plan and follow up within the next 14-21 business days  Kaitlin Banks L. Noelle Penner, RN, BSN, CCM [facility] Telephonic Care Management Care Coordinator Office number [phone] Main [facility] number [phone] Fax number [phone]

## 2020-10-21 ENCOUNTER — Other Ambulatory Visit: Payer: Self-pay | Admitting: *Deleted

## 2020-10-21 NOTE — Patient Outreach (Signed)
[facility]) Care Management  10/21/2020  ANDREW SORIA 05/02/1950 [id number]   [facility] Unsuccessful outreach to post hospital patient Mrs Kaitlin Banks was referred to [facility] on 10/05/20 for post hospital services after her discharge from [facility] on 10/01/20  Last successful outreach on 10/08/20 with her husband, Max Barner  Outreach attempt to the home number [phone] No answer. [facility] RN CM left HIPAA [facility] Portability and Accountability Act) compliant voicemail message along with CM's contact info.   Plan: [facility] RN CM scheduled this patient for another call attempt within 4-7 business days Unsuccessful outreach on 10/21/20   Zora Glendenning L. Noelle Penner, RN, BSN, CCM [facility] Telephonic Care Management Care Coordinator Office number [phone] Mobile number [phone]  Main [facility] number [phone] Fax number [phone]

## 2020-10-28 ENCOUNTER — Other Ambulatory Visit: Payer: Self-pay | Admitting: *Deleted

## 2020-10-28 NOTE — Patient Outreach (Signed)
[facility]) Care Management  10/28/2020  LUMA CLOPPER [birth year] [id number]   [facility] outreach to post hospital patient   Kaitlin Banks was referred to [facility] on 10/05/20 for post hospital services after her discharge from [facility] on 10/01/20  Last outreach on 10/08/20 with Mr Mosso who stated the patient was resting   Outreach attempt to the home number  No answer. [facility] RN CM left HIPAA [facility] Portability and Accountability Act) compliant voicemail message along with CM's contact info.   Plan: [facility] RN CM scheduled this patient for another call attempt within 4-7 business days Unsuccessful outreach letter sent on 10/28/20 Unsuccessful outreach on 10/13/20, 10/28/20   [name], RN, BSN, CCM [facility] Telephonic Care Management Care Coordinator Office number [phone] Mobile number [phone]  Main [facility] number [phone] Fax number [phone]

## 2020-11-06 ENCOUNTER — Other Ambulatory Visit: Payer: Self-pay | Admitting: *Deleted

## 2020-11-06 NOTE — Patient Outreach (Signed)
[facility]) Care Management  11/06/2020  JADELYN ELKS 1950/06/17 [id number]   [facility] third unsuccessful outreach to post hospital patient   Mrs Kaitlin Banks was referred to [facility] on 10/05/20 for post hospital services after her discharge from [facility] on 10/01/20   Last outreach on 10/08/20 with Mr Olvera who stated the patient was resting  Noted in EPIC pt did have follow up with the general surgeon on 10/06/20  Unable to verify if pcp has been seen  Not listed per [facility] in any skilled facilities nor connected with any home health agency   Outreach attempt to the home number [phone] (number Mr Winget answered on 10/08/20) No answer. [facility] RN CM left HIPAA [facility] Portability and Accountability Act) compliant voicemail message along with CM's contact info.    Plan: [facility] RN CM scheduled this patient for case closure per [facility] workflow  Unsuccessful outreach letter sent on 10/28/20- a welcome letter had been sent on 10/20/20 Unsuccessful outreach on 10/13/20, 10/28/20, 11/06/20  forward note to PCP   [name], RN, BSN, CCM [facility] Telephonic Care Management Care Coordinator Office number [phone] Mobile number [phone]  Main [facility] number [phone] Fax number [phone]

## 2020-11-23 ENCOUNTER — Other Ambulatory Visit: Payer: Self-pay | Admitting: *Deleted

## 2020-11-23 NOTE — Patient Outreach (Signed)
[facility]) Care Management  11/23/2020  Kaitlin Banks [birth year] [id number]   [facility] Case closure  Kaitlin Banks was referred to [facility] on 10/05/20 for post hospital services after her discharge from [facility] on 10/01/20   Last outreach on 10/08/20 with [name] who stated the patient was resting  Noted in EPIC pt did have follow up with the general surgeon on 10/06/20  Unable to verify if pcp has been seen  Not listed per [facility] in any skilled facilities nor connected with any home health agency Unsuccessful outreach on 10/13/20, 10/28/20, 11/06/20 Unsuccessful letter sent on 10/28/20  Plan [facility] RN CM will close case after no response from patient  Unable to reach maintain contact Case closure letters sent to patient and MD  [name], RN, BSN, CCM [facility] Telephonic Care Management Care Coordinator Office number [phone] Mobile number [phone]  Main [facility] number [phone] Fax number [phone]

## 2020-11-24 DIAGNOSIS — H25813 Combined forms of age-related cataract, bilateral: Secondary | ICD-10-CM | POA: Diagnosis not present

## 2020-11-24 DIAGNOSIS — H524 Presbyopia: Secondary | ICD-10-CM | POA: Diagnosis not present

## 2020-11-24 DIAGNOSIS — H5213 Myopia, bilateral: Secondary | ICD-10-CM | POA: Diagnosis not present

## 2020-12-30 DIAGNOSIS — J00 Acute nasopharyngitis [common cold]: Secondary | ICD-10-CM | POA: Diagnosis not present

## 2021-12-07 DIAGNOSIS — H524 Presbyopia: Secondary | ICD-10-CM | POA: Diagnosis not present

## 2021-12-07 DIAGNOSIS — Z6827 Body mass index (BMI) 27.0-27.9, adult: Secondary | ICD-10-CM | POA: Diagnosis not present

## 2021-12-07 DIAGNOSIS — H25813 Combined forms of age-related cataract, bilateral: Secondary | ICD-10-CM | POA: Diagnosis not present

## 2021-12-07 DIAGNOSIS — Z124 Encounter for screening for malignant neoplasm of cervix: Secondary | ICD-10-CM | POA: Diagnosis not present

## 2021-12-07 DIAGNOSIS — H5213 Myopia, bilateral: Secondary | ICD-10-CM | POA: Diagnosis not present

## 2021-12-07 DIAGNOSIS — Z01419 Encounter for gynecological examination (general) (routine) without abnormal findings: Secondary | ICD-10-CM | POA: Diagnosis not present

## 2021-12-26 DIAGNOSIS — J0100 Acute maxillary sinusitis, unspecified: Secondary | ICD-10-CM | POA: Diagnosis not present

## 2021-12-26 DIAGNOSIS — B9689 Other specified bacterial agents as the cause of diseases classified elsewhere: Secondary | ICD-10-CM | POA: Diagnosis not present

## 2021-12-26 DIAGNOSIS — J019 Acute sinusitis, unspecified: Secondary | ICD-10-CM | POA: Diagnosis not present

## 2022-03-09 DIAGNOSIS — Z13228 Encounter for screening for other metabolic disorders: Secondary | ICD-10-CM | POA: Diagnosis not present

## 2022-03-09 DIAGNOSIS — Z1329 Encounter for screening for other suspected endocrine disorder: Secondary | ICD-10-CM | POA: Diagnosis not present

## 2022-03-09 DIAGNOSIS — Z130 Encounter for screening for diseases of the blood and blood-forming organs and certain disorders involving the immune mechanism: Secondary | ICD-10-CM | POA: Diagnosis not present

## 2022-03-09 DIAGNOSIS — Z13220 Encounter for screening for lipoid disorders: Secondary | ICD-10-CM | POA: Diagnosis not present

## 2022-03-09 DIAGNOSIS — Z1231 Encounter for screening mammogram for malignant neoplasm of breast: Secondary | ICD-10-CM | POA: Diagnosis not present

## 2022-03-09 DIAGNOSIS — M8588 Other specified disorders of bone density and structure, other site: Secondary | ICD-10-CM | POA: Diagnosis not present

## 2022-03-15 DIAGNOSIS — R09A2 Foreign body sensation, throat: Secondary | ICD-10-CM | POA: Diagnosis not present

## 2022-03-15 DIAGNOSIS — R059 Cough, unspecified: Secondary | ICD-10-CM | POA: Diagnosis not present

## 2022-03-31 DIAGNOSIS — R0602 Shortness of breath: Secondary | ICD-10-CM | POA: Diagnosis not present

## 2022-03-31 DIAGNOSIS — M542 Cervicalgia: Secondary | ICD-10-CM | POA: Diagnosis not present

## 2022-03-31 DIAGNOSIS — J329 Chronic sinusitis, unspecified: Secondary | ICD-10-CM | POA: Diagnosis not present

## 2022-03-31 DIAGNOSIS — R0609 Other forms of dyspnea: Secondary | ICD-10-CM | POA: Diagnosis not present

## 2022-03-31 DIAGNOSIS — D539 Nutritional anemia, unspecified: Secondary | ICD-10-CM | POA: Diagnosis not present

## 2022-03-31 DIAGNOSIS — R059 Cough, unspecified: Secondary | ICD-10-CM | POA: Diagnosis not present

## 2022-04-01 DIAGNOSIS — Z79899 Other long term (current) drug therapy: Secondary | ICD-10-CM | POA: Diagnosis not present

## 2022-04-01 DIAGNOSIS — R0609 Other forms of dyspnea: Secondary | ICD-10-CM | POA: Diagnosis not present

## 2022-04-01 DIAGNOSIS — R7989 Other specified abnormal findings of blood chemistry: Secondary | ICD-10-CM | POA: Diagnosis not present

## 2022-04-01 DIAGNOSIS — J449 Chronic obstructive pulmonary disease, unspecified: Secondary | ICD-10-CM | POA: Diagnosis not present

## 2022-04-01 DIAGNOSIS — R0602 Shortness of breath: Secondary | ICD-10-CM | POA: Diagnosis not present

## 2022-04-22 DIAGNOSIS — R0609 Other forms of dyspnea: Secondary | ICD-10-CM | POA: Diagnosis not present

## 2022-04-22 DIAGNOSIS — J449 Chronic obstructive pulmonary disease, unspecified: Secondary | ICD-10-CM | POA: Diagnosis not present

## 2022-04-22 DIAGNOSIS — R9431 Abnormal electrocardiogram [ECG] [EKG]: Secondary | ICD-10-CM | POA: Diagnosis not present

## 2022-04-22 DIAGNOSIS — R059 Cough, unspecified: Secondary | ICD-10-CM | POA: Diagnosis not present

## 2022-04-22 DIAGNOSIS — R911 Solitary pulmonary nodule: Secondary | ICD-10-CM | POA: Diagnosis not present

## 2022-04-28 DIAGNOSIS — R0602 Shortness of breath: Secondary | ICD-10-CM | POA: Diagnosis not present

## 2022-04-28 DIAGNOSIS — R059 Cough, unspecified: Secondary | ICD-10-CM | POA: Diagnosis not present

## 2022-04-28 DIAGNOSIS — R911 Solitary pulmonary nodule: Secondary | ICD-10-CM | POA: Diagnosis not present

## 2022-05-24 DIAGNOSIS — R911 Solitary pulmonary nodule: Secondary | ICD-10-CM | POA: Diagnosis not present

## 2022-05-24 DIAGNOSIS — J449 Chronic obstructive pulmonary disease, unspecified: Secondary | ICD-10-CM | POA: Diagnosis not present

## 2022-05-24 DIAGNOSIS — R059 Cough, unspecified: Secondary | ICD-10-CM | POA: Diagnosis not present

## 2022-05-24 DIAGNOSIS — K760 Fatty (change of) liver, not elsewhere classified: Secondary | ICD-10-CM | POA: Diagnosis not present

## 2022-06-08 DIAGNOSIS — R059 Cough, unspecified: Secondary | ICD-10-CM | POA: Diagnosis not present

## 2022-06-08 DIAGNOSIS — I2510 Atherosclerotic heart disease of native coronary artery without angina pectoris: Secondary | ICD-10-CM | POA: Diagnosis not present

## 2022-06-17 DIAGNOSIS — Z79899 Other long term (current) drug therapy: Secondary | ICD-10-CM | POA: Diagnosis not present

## 2022-06-17 DIAGNOSIS — R059 Cough, unspecified: Secondary | ICD-10-CM | POA: Diagnosis not present

## 2022-06-30 DIAGNOSIS — R058 Other specified cough: Secondary | ICD-10-CM | POA: Diagnosis not present

## 2022-06-30 DIAGNOSIS — K219 Gastro-esophageal reflux disease without esophagitis: Secondary | ICD-10-CM | POA: Diagnosis not present

## 2022-06-30 DIAGNOSIS — D539 Nutritional anemia, unspecified: Secondary | ICD-10-CM | POA: Diagnosis not present

## 2022-06-30 DIAGNOSIS — R053 Chronic cough: Secondary | ICD-10-CM | POA: Diagnosis not present

## 2022-06-30 DIAGNOSIS — J4530 Mild persistent asthma, uncomplicated: Secondary | ICD-10-CM | POA: Diagnosis not present

## 2022-08-10 DIAGNOSIS — R053 Chronic cough: Secondary | ICD-10-CM | POA: Diagnosis not present

## 2022-08-18 DIAGNOSIS — K219 Gastro-esophageal reflux disease without esophagitis: Secondary | ICD-10-CM | POA: Diagnosis not present

## 2022-08-18 DIAGNOSIS — Z8719 Personal history of other diseases of the digestive system: Secondary | ICD-10-CM | POA: Diagnosis not present

## 2022-08-18 DIAGNOSIS — Z8709 Personal history of other diseases of the respiratory system: Secondary | ICD-10-CM | POA: Diagnosis not present

## 2022-08-18 DIAGNOSIS — R059 Cough, unspecified: Secondary | ICD-10-CM | POA: Diagnosis not present

## 2022-08-29 DIAGNOSIS — Z79899 Other long term (current) drug therapy: Secondary | ICD-10-CM | POA: Diagnosis not present

## 2022-08-29 DIAGNOSIS — K219 Gastro-esophageal reflux disease without esophagitis: Secondary | ICD-10-CM | POA: Diagnosis not present

## 2022-08-29 DIAGNOSIS — J449 Chronic obstructive pulmonary disease, unspecified: Secondary | ICD-10-CM | POA: Diagnosis not present

## 2022-08-29 DIAGNOSIS — R911 Solitary pulmonary nodule: Secondary | ICD-10-CM | POA: Diagnosis not present

## 2022-08-29 DIAGNOSIS — R053 Chronic cough: Secondary | ICD-10-CM | POA: Diagnosis not present

## 2022-09-08 DIAGNOSIS — J4530 Mild persistent asthma, uncomplicated: Secondary | ICD-10-CM | POA: Diagnosis not present

## 2022-09-08 DIAGNOSIS — R058 Other specified cough: Secondary | ICD-10-CM | POA: Diagnosis not present

## 2022-09-08 DIAGNOSIS — K449 Diaphragmatic hernia without obstruction or gangrene: Secondary | ICD-10-CM | POA: Diagnosis not present

## 2022-09-08 DIAGNOSIS — R918 Other nonspecific abnormal finding of lung field: Secondary | ICD-10-CM | POA: Diagnosis not present

## 2022-09-08 DIAGNOSIS — R911 Solitary pulmonary nodule: Secondary | ICD-10-CM | POA: Diagnosis not present

## 2022-09-08 DIAGNOSIS — R053 Chronic cough: Secondary | ICD-10-CM | POA: Diagnosis not present

## 2022-11-17 DIAGNOSIS — R053 Chronic cough: Secondary | ICD-10-CM | POA: Diagnosis not present

## 2022-11-17 DIAGNOSIS — K760 Fatty (change of) liver, not elsewhere classified: Secondary | ICD-10-CM | POA: Diagnosis not present

## 2022-11-30 DIAGNOSIS — R053 Chronic cough: Secondary | ICD-10-CM | POA: Diagnosis not present

## 2022-11-30 DIAGNOSIS — Z79899 Other long term (current) drug therapy: Secondary | ICD-10-CM | POA: Diagnosis not present

## 2022-11-30 DIAGNOSIS — J449 Chronic obstructive pulmonary disease, unspecified: Secondary | ICD-10-CM | POA: Diagnosis not present

## 2022-11-30 DIAGNOSIS — R911 Solitary pulmonary nodule: Secondary | ICD-10-CM | POA: Diagnosis not present

## 2023-01-30 DIAGNOSIS — R300 Dysuria: Secondary | ICD-10-CM | POA: Diagnosis not present

## 2023-02-16 DIAGNOSIS — R319 Hematuria, unspecified: Secondary | ICD-10-CM | POA: Diagnosis not present

## 2023-04-30 NOTE — Therapy (Signed)
 OUTPATIENT PHYSICAL THERAPY FEMALE PELVIC EVALUATION   Patient Name: Kaitlin Banks MRN: [id number] DOB:05/29/1950, 73 y.o., female Today's Date: 05/01/2023  END OF SESSION:  PT End of Session - 05/01/23 1832     Visit Number 1    Authorization Type BCBS no auth req    Authorization Time Period 05/01/2023- 10/16/2023    PT Start Time 1145    PT Stop Time 1245    PT Time Calculation (min) 60 min    Activity Tolerance Patient tolerated treatment well    Behavior During Therapy [facility] for tasks assessed/performed             Past Medical History:  Diagnosis Date   Asthma    Diabetes mellitus    Migraine headache    Past Surgical History:  Procedure Laterality Date   TONSILLECTOMY     Patient Active Problem List   Diagnosis Date Noted   Calculus of gallbladder with acute cholecystitis without obstruction 10/06/2020   Postoperative examination 10/06/2020   Chronic vulvitis 11/30/2017   Women's annual routine gynecological examination 11/22/2017   Vaginal burning 10/10/2017   Cerebral vasospasm 01/28/2011   Subarachnoid hemorrhage (HCC) 01/15/2011    PCP: Crist Fat, MD  REFERRING PROVIDER: Randa Spike, FNP  REFERRING DIAG: N81.9 (ICD-10-CM) - 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  She would rather do anything than have an operation. Pt lives about 1 hr away Had PT on her back before. Back is ok now.  Pt reports that her bladder will empty  out and then will dribble after that.  Pt has an appt with the doctor- Uro gyne, pt not sure.  Pt reports that she got sick a year and half ago- cough is lingering, when she coughs a lot, she will leak. Not all the time Has been going to all kinds of doctors in [place] at [facility] She will be in the grocery store or anywhere and have a coughing fit, especially when someone is wearing a strong perfume. It is frustrating.  Pt reports that she used to be more active before her  brain bleed 10-11 years ago, she has  a lot of psychological problems since then. She reports that she about died.  Cannot sleep at night. Sometimes falls asleep at 7 or 8 a.m.  Fluid intake: hot water, cold water, a lot water, hot and cold tea, cranberry and grape juice   PAIN: acid reflux from hernia- pain in her throat  PRECAUTIONS: None  RED FLAGS: None   WEIGHT BEARING RESTRICTIONS: No  FALLS:  Has patient fallen in last 6 months? No  OCCUPATION: retired  ACTIVITY LEVEL : works in her yard with warm weather, does not exercises or walks like she used to,   PLOF: Independent  PATIENT GOALS: to not have uneasy feeling down there and to be able to hold her urine  PERTINENT HISTORY:  Brain bleed Sexual abuse: No  BOWEL MOVEMENT: no issues   URINATION: Pain with urination: No can feel pressure Fully empty bladder: Yes: sometimes not all the time- Stream: Strong and Weak Urgency: Yes - sometimes Frequency: JIC pees, sometimes every half hour, sometimes every hour Leakage: Urge to void, Coughing, and Sneezing- more with coughing Pads: Yes: 0-1 liners  INTERCOURSE: not active   PREGNANCY: Vaginal deliveries 1 Tearing No Episiotomy No C-section deliveries 0 Currently pregnant No  PROLAPSE: Pressure   OBJECTIVE:  Note: Objective measures were completed at Evaluation unless otherwise noted.   PATIENT SURVEYS:    PFIQ-7: 21  COGNITION: Overall cognitive status: Within functional limits for  tasks assessed     SENSATION: Light touch: Appears intact  GAIT: Assistive device utilized: None Comments: slow  POSTURE: rounded shoulders and forward head   LUMBARAROM/PROM: to be assessed  A/PROM A/PROM  eval  Flexion   Extension   Right lateral flexion   Left lateral flexion   Right rotation   Left rotation    (Blank rows = not tested)  LOWER EXTREMITY ROM: grossly within functional limitations    LOWER EXTREMITY MMT: weakness present throughout bilateral  hips and knees  PALPATION:   General: upper chest breathing  Pelvic Alignment: seems even  Abdominal: able to contract with transverse abdominis breath                 External Perineal Exam: to be assessed                             Internal Pelvic Floor: to be assessed  Patient confirms identification and approves PT to assess internal pelvic floor and treatment Yes  PELVIC MMT:   MMT eval  Vaginal   Internal Anal Sphincter   External Anal Sphincter   Puborectalis   Diastasis Recti   (Blank rows = not tested)        TONE: To be assessed   PROLAPSE: To be assessed   TODAY'S TREATMENT:  DATE: 05/01/23   EVAL see below  Neuro reed- ball press with transverse abdominis breath with PF supine and seated HA with thera band with PF and transverse abdominis breath     PATIENT EDUCATION/ there act:  Education details: relevant anatomy, HEP, exam findings Person educated: Patient Education method: Explanation, Demonstration, Tactile cues, Verbal cues, and Handouts Education comprehension: verbalized understanding, returned demonstration, verbal cues required, tactile cues required, and needs further education  HOME EXERCISE PROGRAM: [email or web address]  ASSESSMENT:  CLINICAL IMPRESSION: Patient is a 73 y.o. F who was seen today for physical therapy evaluation and treatment for  SUI and urinary urgency and leaking after Covid  with  chronic cough. Bilateral lower extremity weakness present. She did well with education, and initial exercises and will benefit from physical therapy to reduce leakage. She dem decreased pace of communication and comprehension d/t hx of stroke but was able to dem exercises with good form. HEP provided. Pt lives an hour away.   OBJECTIVE IMPAIRMENTS: Abnormal gait, decreased activity tolerance, decreased cognition, decreased coordination, decreased endurance, difficulty walking, and decreased strength.   ACTIVITY LIMITATIONS: continence and toileting  PARTICIPATION LIMITATIONS: community activity  PERSONAL FACTORS: Fitness, Transportation, and 1 comorbidity: hx of stroke  are also affecting patient's functional outcome.   REHAB POTENTIAL: Good  CLINICAL DECISION MAKING: Evolving/moderate complexity  EVALUATION COMPLEXITY: Moderate   GOALS: Goals reviewed with patient? Yes  SHORT TERM GOALS: Target date: 05/29/2023    Pt will be independent with the knack, urge suppression technique, and double voiding in order to improve bladder habits and decrease urinary incontinence.   Baseline: Goal status: INITIAL  2.  Pt will be independent with initial HEP.   Baseline:  Goal status: INITIAL   LONG TERM GOALS: Target date: 6 months  Pt will be I advanced HEP Baseline:  Goal status: INITIAL  2.  Pt will soak 0 pads/ day Baseline:  Goal status: INITIAL  3.  Pt will have reduced PFIQ-7 to max 15 points Baseline:  Goal status: INITIAL  4.  Pt will report complete bladder emptying Baseline:  Goal status: INITIAL    PLAN:  PT FREQUENCY: 1-2x/week  PT DURATION: 6 months  PLANNED INTERVENTIONS: 97110-Therapeutic exercises, 97530- Therapeutic activity, 97112- Neuromuscular re-education, 97535- Self Care, 10272- Manual therapy, Dry Needling, Joint mobilization, Joint manipulation, Spinal manipulation, Spinal mobilization, and  Biofeedback  PLAN FOR NEXT SESSION: internal pelvic floor muscles assessment   Nil Xiong, PT 05/01/2023, 6:37 PM

## 2023-05-01 ENCOUNTER — Other Ambulatory Visit: Payer: Self-pay

## 2023-05-01 ENCOUNTER — Encounter: Payer: Self-pay | Admitting: Physical Therapy

## 2023-05-01 ENCOUNTER — Ambulatory Visit: Payer: Medicare Other | Attending: Family | Admitting: Physical Therapy

## 2023-05-01 DIAGNOSIS — N819 Female genital prolapse, unspecified: Secondary | ICD-10-CM | POA: Insufficient documentation

## 2023-05-01 DIAGNOSIS — M6281 Muscle weakness (generalized): Secondary | ICD-10-CM | POA: Insufficient documentation

## 2023-05-01 DIAGNOSIS — R278 Other lack of coordination: Secondary | ICD-10-CM | POA: Diagnosis not present

## 2023-05-24 ENCOUNTER — Ambulatory Visit: Admitting: Physical Therapy

## 2023-05-24 ENCOUNTER — Encounter: Payer: Self-pay | Admitting: Physical Therapy

## 2023-05-24 DIAGNOSIS — N819 Female genital prolapse, unspecified: Secondary | ICD-10-CM | POA: Diagnosis not present

## 2023-05-24 DIAGNOSIS — R278 Other lack of coordination: Secondary | ICD-10-CM

## 2023-05-24 DIAGNOSIS — M6281 Muscle weakness (generalized): Secondary | ICD-10-CM

## 2023-05-24 NOTE — Therapy (Addendum)
 OUTPATIENT PHYSICAL THERAPY FEMALE PELVIC TREATMENT   Patient Name: Kaitlin Banks MRN: [id number] DOB:[birth year], 73 y.o., female Today's Date: 05/24/2023  END OF SESSION:  PT End of Session - 05/24/23 1145     Visit Number 2    Authorization Type BCBS no auth req    Authorization Time Period 05/01/2023- 10/16/2023    PT Start Time 1100    PT Stop Time 1150    PT Time Calculation (min) 50 min    Activity Tolerance Patient tolerated treatment well    Behavior During Therapy [facility] for tasks assessed/performed              Past Medical History:  Diagnosis Date   Asthma    Diabetes mellitus    Migraine headache    Past Surgical History:  Procedure Laterality Date   TONSILLECTOMY     Patient Active Problem List   Diagnosis Date Noted   Calculus of gallbladder with acute cholecystitis without obstruction 10/06/2020   Postoperative examination 10/06/2020   Chronic vulvitis 11/30/2017   Women's annual routine gynecological examination 11/22/2017   Vaginal burning 10/10/2017   Cerebral vasospasm 01/28/2011   Subarachnoid hemorrhage (HCC) 01/15/2011    PCP: Wayne Haines, MD  REFERRING PROVIDER: Cornelia Dieter, FNP  REFERRING DIAG: N81.9 (ICD-10-CM) - 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 Does ankle pumps. Uses a machine that vibrates at home Dr. Wilhelmenia Harada vibration plate. Has been doing standing marching. Does not feel so much like something in her pelvis is falling Has been able to empty her bladder better.  Yesterday felt the best she has in a hear and half,  using a new magnesium spray. Has had decreased cough.  Tends to do natural things rather then drugs.  Acupuncture works for her back Uneasy feeling down there is about 3-4/10 now Reports that she has long Covid, some acid reflux as well Reports consistency with HEP, thinks the exercises are helping Does not drive since she has had the brain bleed Coughing messed her body up.     PAIN: acid reflux from hernia- pain in her throat  PRECAUTIONS: None  RED FLAGS: None   WEIGHT BEARING RESTRICTIONS: No  FALLS:  Has patient fallen  in last 6 months? No  OCCUPATION: retired  ACTIVITY LEVEL : works in her yard with warm weather, does not exercises or walks like she used to,   PLOF: Independent  PATIENT GOALS: to not have uneasy feeling down there and to be able to hold her urine  PERTINENT HISTORY:  Brain bleed Sexual abuse: No  BOWEL MOVEMENT: no issues   URINATION: Pain with urination: No can feel pressure Fully empty bladder: Yes: sometimes not all the time- Stream: Strong and Weak Urgency: Yes - sometimes Frequency: JIC pees, sometimes every half hour, sometimes every hour Leakage: Urge to void, Coughing, and Sneezing- more with coughing Pads: Yes: 0-1 liners  INTERCOURSE: not active   PREGNANCY: Vaginal deliveries 1 Tearing No Episiotomy No C-section deliveries 0 Currently pregnant No  PROLAPSE: Pressure   OBJECTIVE:  Note: Objective measures were completed at Evaluation unless otherwise noted.   PATIENT SURVEYS:    PFIQ-7: 63  COGNITION: Overall cognitive status: Within functional limits for tasks assessed     SENSATION: Light touch: Appears intact  GAIT: Assistive device utilized: None Comments: slow  POSTURE: rounded shoulders and forward head   LUMBARAROM/PROM: some stiffness throughout  A/PROM A/PROM  eval  Flexion   Extension   Right lateral flexion   Left lateral flexion   Right rotation   Left rotation    (Blank rows = not  tested)  LOWER EXTREMITY ROM: grossly within functional limitations    LOWER EXTREMITY MMT: weakness present throughout bilateral  hips and knees  PALPATION:   General: upper chest breathing  Pelvic Alignment: seems even  Abdominal: able to contract with transverse abdominis breath                 External Perineal Exam: to be assessed                             Internal Pelvic Floor: to be assessed  Patient confirms identification and approves PT to assess internal pelvic floor and treatment no  PELVIC MMT:   MMT eval  Vaginal   Internal Anal Sphincter   External Anal Sphincter   Puborectalis   Diastasis Recti   (Blank rows = not tested)        TONE: To be assessed   PROLAPSE: To be assessed   TODAY'S TREATMENT:                                                                                                                              DATE: 05/24/23    Neuro reed- ball press with transverse abdominis breath with PF supine and seated HA with thera band with PF and transverse abdominis breath    There act- review of progress, HEP  PATIENT EDUCATION/ there act:  Education details: relevant anatomy, HEP, exam findings Person educated: Patient Education method: Explanation, Demonstration, Tactile cues, Verbal cues, and Handouts Education comprehension: verbalized understanding,  returned demonstration, verbal cues required, tactile cues required, and needs further education  HOME EXERCISE PROGRAM Access Code: [email or web address] URL: [email or web address] Date: 05/24/2023 Prepared by: Jameson Mcburney Brylee Mcgreal  Exercises - Seated Abdominal Press into [employer]  - 1 x daily - 7 x weekly - 3 sets - 10 reps - Supine Bridge with Pelvic Floor Contraction  - 1 x daily - 7 x weekly - 3 sets - 10 reps - Abdominal Press into Myra  - 1 x daily - 7 x weekly - 3 sets - 10 reps - Seated Cough with Pelvic Floor Contraction and Hand to Mouth  - 1 x daily - 7 x weekly - 3 sets - 10  reps - Seated Quick Flick Pelvic Floor Contractions  - 1 x daily - 7 x weekly - 3 sets - 10 reps - Pelvic Floor Contractions in [place] with Adduction  - 1 x daily - 7 x weekly - 3 sets - 10 reps - Quadruped Exhale with Pelvic Floor Contraction  - 1 x daily - 7 x weekly - 3 sets - 10 reps - Horizontal abd with TB with TRA breath  - 1 x daily - 7 x weekly - 3 sets - 10 reps  Patient Education - Get To Know Your Pelvic Floor- Female - Urinary Urge Control Techniques - Lifestyle Changes to Support Urinary and Bladder Health  ASSESSMENT:  CLINICAL IMPRESSION: Pt seems to be progressing well with her urge drills and exercises. Has not had any cough, which has been helpful as well. Did not want internal today. Discussed importance of consistency with her HEP.   OBJECTIVE IMPAIRMENTS: Abnormal gait, decreased activity tolerance, decreased cognition, decreased coordination, decreased endurance, difficulty walking, and decreased strength.   ACTIVITY LIMITATIONS: continence and toileting  PARTICIPATION LIMITATIONS: community activity  PERSONAL FACTORS: Fitness, Transportation, and 1 comorbidity: hx of stroke  are also affecting patient's functional outcome.   REHAB POTENTIAL: Good  CLINICAL DECISION MAKING: Evolving/moderate complexity  EVALUATION COMPLEXITY: Moderate   GOALS: Goals reviewed with patient? Yes  SHORT TERM GOALS: Target date: 05/29/2023    Pt will be independent with the knack, urge suppression technique, and double voiding in order to improve bladder habits and decrease urinary incontinence.   Baseline: Goal status: INITIAL  2.  Pt will be independent with initial HEP.   Baseline:  Goal status: progressing   LONG TERM GOALS: Target date: 6 months  Pt will be I advanced HEP Baseline:  Goal status: INITIAL  2.  Pt will soak 0 pads/ day Baseline:  Goal status: INITIAL  3.  Pt will have reduced PFIQ-7 to max 15 points Baseline:  Goal status: INITIAL  4.   Pt will report complete bladder emptying Baseline:  Goal status: progressing    PLAN:  PT FREQUENCY: 1-2x/week  PT DURATION: 6 months  PLANNED INTERVENTIONS: 97110-Therapeutic exercises, 97530- Therapeutic activity, 97112- Neuromuscular re-education, 97535- Self Care, 54098- Manual therapy, Dry Needling, Joint mobilization, Joint manipulation, Spinal manipulation, Spinal mobilization, and Biofeedback  PLAN FOR NEXT SESSION: cont core exercises for SUI   Dilraj Killgore, PT 05/24/2023, 11:54 AM

## 2023-06-15 ENCOUNTER — Ambulatory Visit: Attending: Family | Admitting: Physical Therapy

## 2023-06-15 ENCOUNTER — Encounter: Payer: Self-pay | Admitting: Physical Therapy

## 2023-06-15 DIAGNOSIS — M6281 Muscle weakness (generalized): Secondary | ICD-10-CM | POA: Diagnosis present

## 2023-06-15 DIAGNOSIS — R278 Other lack of coordination: Secondary | ICD-10-CM | POA: Diagnosis present

## 2023-06-15 NOTE — Therapy (Signed)
 OUTPATIENT PHYSICAL THERAPY FEMALE PELVIC TREATMENT   Patient Name: Kaitlin Banks MRN: [id number] DOB:1950/07/29, 73 y.o., female Today's Date: 06/15/2023  END OF SESSION:  PT End of Session - 06/15/23 1405     Visit Number 3    Authorization Type [employer] no auth req    Authorization Time Period 05/01/2023- 10/16/2023    PT Start Time 1400    PT Stop Time 1440    PT Time Calculation (min) 40 min    Activity Tolerance Patient tolerated treatment well    Behavior During Therapy [facility] for tasks assessed/performed               Past Medical History:  Diagnosis Date   Asthma    Diabetes mellitus    Migraine headache    Past Surgical History:  Procedure Laterality Date   TONSILLECTOMY     Patient Active Problem List   Diagnosis Date Noted   Calculus of gallbladder with acute cholecystitis without obstruction 10/06/2020   Postoperative examination 10/06/2020   Chronic vulvitis 11/30/2017   Women's annual routine gynecological examination 11/22/2017   Vaginal burning 10/10/2017   Cerebral vasospasm 01/28/2011   Subarachnoid hemorrhage (HCC) 01/15/2011    PCP: Crist Fat, MD  REFERRING PROVIDER: Randa Spike, FNP  REFERRING DIAG: N81.9 (ICD-10-CM) - Female genital prolapse, unspecified  THERAPY DIAG:  Muscle weakness (generalized)  Other lack of coordination  Rationale for Evaluation and Treatment: Rehabilitation  ONSET DATE: few months  SUBJECTIVE:                                                                                                                                                                                           SUBJECTIVE STATEMENT: Pt reports that she is doing well, it is helping, she is busy in the yard.  Pt does not want  internal pelvic floor muscle assessment d/t physical next week.  Exercises are going well.  Able to empty her bladder better, does not have pressure in her pelvic, it really helps.  Magnesium spray worked really  good until last week when pollen got worse around her house. Coughing again some.  Wears a mask outside Pressure in her pelvic 3-4/10. Once she gets to coughing, it gets her Pt reports that leaking is better- getting close to 50% of improvement Leaking was better when she was not coughing  PAIN: acid reflux from hernia- pain in her throat  PRECAUTIONS: None  RED FLAGS: None   WEIGHT BEARING RESTRICTIONS: No  FALLS:  Has patient fallen in last 6 months? No  OCCUPATION: retired  ACTIVITY LEVEL : works in her yard with  warm weather, does not exercises or walks like she used to,   PLOF: Independent  PATIENT GOALS: to not have uneasy feeling down there and to be able to hold her urine  PERTINENT HISTORY:  Brain bleed Sexual abuse: No  BOWEL MOVEMENT: no issues   URINATION: Pain with urination: No can feel pressure Fully empty bladder: Yes: sometimes not all the time- Stream: Strong and Weak Urgency: Yes - sometimes Frequency: JIC pees, sometimes every half hour, sometimes every hour Leakage: Urge to void, Coughing, and Sneezing- more with coughing Pads: Yes: 0-1 liners  INTERCOURSE: not active   PREGNANCY: Vaginal deliveries 1 Tearing No Episiotomy No C-section deliveries 0 Currently pregnant No  PROLAPSE: Pressure   OBJECTIVE:  Note: Objective measures were completed at Evaluation unless otherwise noted.   PATIENT SURVEYS:    PFIQ-7: 57  COGNITION: Overall cognitive status: Within functional limits for tasks assessed     SENSATION: Light touch: Appears intact  GAIT: Assistive device utilized: None Comments: slow  POSTURE: rounded shoulders and forward head   LUMBARAROM/PROM: some stiffness throughout   LOWER EXTREMITY ROM: grossly within functional limitations    LOWER EXTREMITY MMT: weakness present throughout bilateral  hips and knees  PALPATION:   General: upper chest breathing  Pelvic Alignment: seems even  Abdominal: able to  contract with transverse abdominis breath                PELVIC MMT: able to lift pelvic floor with exercises- assessed externally         TODAY'S TREATMENT:                                                                                                                              DATE: 06/15/23    Neuro reed- ball press with transverse abdominis breath with PF supine and seated HA with thera band with PF and transverse abdominis breath    There act- review of progress, HEP  PATIENT EDUCATION/ there act:  Education details: relevant anatomy, HEP, exam findings Person educated: Patient Education method: Explanation, Demonstration, Tactile cues, Verbal cues, and Handouts Education comprehension: verbalized understanding, returned demonstration, verbal cues required, tactile cues required, and needs further education  HOME EXERCISE PROGRAM Access Code: [email or web address] URL: [email or web address] Date: 05/24/2023 Prepared by: Darrell Jewel Kendrik Mcshan  Exercises - Seated Abdominal Press into [employer]  - 1 x daily - 7 x weekly - 3 sets - 10 reps - Supine Bridge with Pelvic Floor Contraction  - 1 x daily - 7 x weekly - 3 sets - 10 reps - Abdominal Press into [place]  - 1 x daily - 7 x weekly - 3 sets - 10 reps - Seated Cough with Pelvic Floor Contraction and Hand to Mouth  - 1 x daily - 7 x weekly - 3 sets - 10 reps - Seated Quick Flick Pelvic Floor Contractions  - 1 x daily - 7 x weekly - 3 sets - 10 reps -  Pelvic Floor Contractions in [place] with Adduction  - 1 x daily - 7 x weekly - 3 sets - 10 reps - Quadruped Exhale with Pelvic Floor Contraction  - 1 x daily - 7 x weekly - 3 sets - 10 reps - Horizontal abd with TB with TRA breath  - 1 x daily - 7 x weekly - 3 sets - 10 reps  Patient Education - Get To Know Your Pelvic Floor- Female - Urinary Urge Control Techniques - Lifestyle Changes to Support Urinary and Bladder Health  ASSESSMENT:  CLINICAL IMPRESSION: Pt progressing well  towards her goals. She reported about 50% improvement in vaginal pressure and leaking Did not want internal again today. Difficulty with processing a little Pt with some hip weakness as well, will continue to benefit from PT      OBJECTIVE IMPAIRMENTS: Abnormal gait, decreased activity tolerance, decreased cognition, decreased coordination, decreased endurance, difficulty walking, and decreased strength.   ACTIVITY LIMITATIONS: continence and toileting  PARTICIPATION LIMITATIONS: community activity  PERSONAL FACTORS: Fitness, Transportation, and 1 comorbidity: hx of stroke  are also affecting patient's functional outcome.   REHAB POTENTIAL: Good  CLINICAL DECISION MAKING: Evolving/moderate complexity  EVALUATION COMPLEXITY: Moderate   GOALS: Goals reviewed with patient? Yes  SHORT TERM GOALS: Target date: 05/29/2023    Pt will be independent with the knack, urge suppression technique, and double voiding in order to improve bladder habits and decrease urinary incontinence.   Baseline: Goal status: progressing  2.  Pt will be independent with initial HEP.   Baseline:  Goal status: met   LONG TERM GOALS: Target date: 6 months  Pt will be I advanced HEP Baseline:  Goal status: INITIAL  2.  Pt will soak 0 pads/ day Baseline:  Goal status: INITIAL  3.  Pt will have reduced PFIQ-7 to max 15 points Baseline:  Goal status: INITIAL  4.  Pt will report complete bladder emptying Baseline:  Goal status: progressing    PLAN:  PT FREQUENCY: 1-2x/week  PT DURATION: 6 months  PLANNED INTERVENTIONS: 97110-Therapeutic exercises, 97530- Therapeutic activity, 97112- Neuromuscular re-education, 97535- Self Care, [id number]- Manual therapy, Dry Needling, Joint mobilization, Joint manipulation, Spinal manipulation, Spinal mobilization, and Biofeedback  PLAN FOR NEXT SESSION: cont core exercises for SUI   Tinnie Kunin, PT 06/15/2023, 2:29 PM

## 2023-06-22 ENCOUNTER — Encounter: Admitting: Physical Therapy

## 2023-06-29 ENCOUNTER — Ambulatory Visit: Admitting: Physical Therapy
